# Patient Record
Sex: Female | Born: 1955 | Race: White | Hispanic: No | Marital: Married | Smoking: Former smoker
Health system: Southern US, Community
[De-identification: ages and names within clinical notes are randomized; demographics above are authoritative.]

## PROBLEM LIST (undated history)

## (undated) DIAGNOSIS — J449 Chronic obstructive pulmonary disease, unspecified: Secondary | ICD-10-CM

## (undated) DIAGNOSIS — M797 Fibromyalgia: Secondary | ICD-10-CM

## (undated) DIAGNOSIS — E119 Type 2 diabetes mellitus without complications: Secondary | ICD-10-CM

## (undated) DIAGNOSIS — G43909 Migraine, unspecified, not intractable, without status migrainosus: Secondary | ICD-10-CM

## (undated) DIAGNOSIS — H8109 Meniere's disease, unspecified ear: Secondary | ICD-10-CM

## (undated) DIAGNOSIS — M109 Gout, unspecified: Secondary | ICD-10-CM

## (undated) DIAGNOSIS — F419 Anxiety disorder, unspecified: Secondary | ICD-10-CM

## (undated) DIAGNOSIS — G5600 Carpal tunnel syndrome, unspecified upper limb: Secondary | ICD-10-CM

## (undated) HISTORY — DX: Migraine, unspecified, not intractable, without status migrainosus: G43.909

## (undated) HISTORY — DX: Carpal tunnel syndrome, unspecified upper limb: G56.00

## (undated) HISTORY — DX: Anxiety disorder, unspecified: F41.9

---

## 1997-10-01 ENCOUNTER — Ambulatory Visit (HOSPITAL_COMMUNITY): Admission: RE | Admit: 1997-10-01 | Discharge: 1997-10-01 | Payer: Self-pay | Admitting: Obstetrics and Gynecology

## 1997-10-29 ENCOUNTER — Ambulatory Visit (HOSPITAL_COMMUNITY): Admission: RE | Admit: 1997-10-29 | Discharge: 1997-10-29 | Payer: Self-pay | Admitting: Obstetrics and Gynecology

## 1997-12-12 ENCOUNTER — Ambulatory Visit (HOSPITAL_COMMUNITY): Admission: RE | Admit: 1997-12-12 | Discharge: 1997-12-12 | Payer: Self-pay | Admitting: Obstetrics and Gynecology

## 1997-12-19 ENCOUNTER — Ambulatory Visit (HOSPITAL_COMMUNITY): Admission: RE | Admit: 1997-12-19 | Discharge: 1997-12-19 | Payer: Self-pay | Admitting: Obstetrics and Gynecology

## 1998-02-19 ENCOUNTER — Inpatient Hospital Stay (HOSPITAL_COMMUNITY): Admission: AD | Admit: 1998-02-19 | Discharge: 1998-02-20 | Payer: Self-pay | Admitting: Obstetrics and Gynecology

## 2002-09-21 ENCOUNTER — Other Ambulatory Visit: Admission: RE | Admit: 2002-09-21 | Discharge: 2002-09-21 | Payer: Self-pay | Admitting: Obstetrics and Gynecology

## 2002-12-06 ENCOUNTER — Ambulatory Visit (HOSPITAL_COMMUNITY): Admission: RE | Admit: 2002-12-06 | Discharge: 2002-12-06 | Payer: Self-pay | Admitting: Family Medicine

## 2002-12-06 ENCOUNTER — Encounter: Payer: Self-pay | Admitting: Family Medicine

## 2009-12-02 ENCOUNTER — Other Ambulatory Visit: Admission: RE | Admit: 2009-12-02 | Discharge: 2009-12-02 | Payer: Self-pay | Admitting: Family Medicine

## 2014-10-04 ENCOUNTER — Ambulatory Visit
Admission: RE | Admit: 2014-10-04 | Discharge: 2014-10-04 | Disposition: A | Discharge status: Home / Self Care | Payer: BLUE CROSS/BLUE SHIELD | Source: Ambulatory Visit | Attending: Family Medicine | Admitting: Family Medicine

## 2014-10-04 ENCOUNTER — Other Ambulatory Visit: Payer: Self-pay | Admitting: Family Medicine

## 2014-10-04 DIAGNOSIS — M79672 Pain in left foot: Secondary | ICD-10-CM

## 2015-11-05 DIAGNOSIS — M545 Low back pain: Secondary | ICD-10-CM | POA: Diagnosis not present

## 2016-06-12 DIAGNOSIS — J0110 Acute frontal sinusitis, unspecified: Secondary | ICD-10-CM | POA: Diagnosis not present

## 2016-07-27 DIAGNOSIS — J301 Allergic rhinitis due to pollen: Secondary | ICD-10-CM | POA: Diagnosis not present

## 2016-07-27 DIAGNOSIS — R490 Dysphonia: Secondary | ICD-10-CM | POA: Diagnosis not present

## 2016-07-27 DIAGNOSIS — H8103 Meniere's disease, bilateral: Secondary | ICD-10-CM | POA: Diagnosis not present

## 2016-08-18 DIAGNOSIS — J410 Simple chronic bronchitis: Secondary | ICD-10-CM | POA: Diagnosis not present

## 2016-08-18 DIAGNOSIS — J370 Chronic laryngitis: Secondary | ICD-10-CM | POA: Diagnosis not present

## 2016-08-18 DIAGNOSIS — J320 Chronic maxillary sinusitis: Secondary | ICD-10-CM | POA: Diagnosis not present

## 2016-08-18 DIAGNOSIS — J322 Chronic ethmoidal sinusitis: Secondary | ICD-10-CM | POA: Diagnosis not present

## 2016-10-24 DIAGNOSIS — M109 Gout, unspecified: Secondary | ICD-10-CM | POA: Diagnosis not present

## 2016-11-20 DIAGNOSIS — J019 Acute sinusitis, unspecified: Secondary | ICD-10-CM | POA: Diagnosis not present

## 2016-12-04 DIAGNOSIS — J209 Acute bronchitis, unspecified: Secondary | ICD-10-CM | POA: Diagnosis not present

## 2016-12-04 DIAGNOSIS — M7061 Trochanteric bursitis, right hip: Secondary | ICD-10-CM | POA: Diagnosis not present

## 2016-12-04 DIAGNOSIS — M7062 Trochanteric bursitis, left hip: Secondary | ICD-10-CM | POA: Diagnosis not present

## 2017-01-19 ENCOUNTER — Ambulatory Visit
Admission: RE | Admit: 2017-01-19 | Discharge: 2017-01-19 | Disposition: A | Discharge status: Home / Self Care | Payer: BLUE CROSS/BLUE SHIELD | Source: Ambulatory Visit | Attending: Family Medicine | Admitting: Family Medicine

## 2017-01-19 ENCOUNTER — Other Ambulatory Visit: Payer: Self-pay | Admitting: Family Medicine

## 2017-01-19 DIAGNOSIS — J40 Bronchitis, not specified as acute or chronic: Secondary | ICD-10-CM | POA: Diagnosis not present

## 2017-01-19 DIAGNOSIS — R062 Wheezing: Secondary | ICD-10-CM | POA: Diagnosis not present

## 2017-02-08 DIAGNOSIS — R0602 Shortness of breath: Secondary | ICD-10-CM | POA: Diagnosis not present

## 2017-02-08 DIAGNOSIS — R062 Wheezing: Secondary | ICD-10-CM | POA: Diagnosis not present

## 2017-04-09 DIAGNOSIS — Z23 Encounter for immunization: Secondary | ICD-10-CM | POA: Diagnosis not present

## 2017-05-31 DIAGNOSIS — J410 Simple chronic bronchitis: Secondary | ICD-10-CM | POA: Diagnosis not present

## 2017-06-15 ENCOUNTER — Encounter: Payer: Self-pay | Admitting: Internal Medicine

## 2017-06-15 ENCOUNTER — Ambulatory Visit (INDEPENDENT_AMBULATORY_CARE_PROVIDER_SITE_OTHER): Payer: BLUE CROSS/BLUE SHIELD | Admitting: Internal Medicine

## 2017-06-15 ENCOUNTER — Other Ambulatory Visit (INDEPENDENT_AMBULATORY_CARE_PROVIDER_SITE_OTHER): Payer: BLUE CROSS/BLUE SHIELD

## 2017-06-15 ENCOUNTER — Ambulatory Visit (INDEPENDENT_AMBULATORY_CARE_PROVIDER_SITE_OTHER)
Admission: RE | Admit: 2017-06-15 | Discharge: 2017-06-15 | Disposition: A | Discharge status: Home / Self Care | Payer: BLUE CROSS/BLUE SHIELD | Source: Ambulatory Visit | Attending: Internal Medicine | Admitting: Internal Medicine

## 2017-06-15 VITALS — BP 120/76 | HR 83 | Ht 64.0 in | Wt 238.8 lb

## 2017-06-15 DIAGNOSIS — R0600 Dyspnea, unspecified: Secondary | ICD-10-CM

## 2017-06-15 DIAGNOSIS — R0609 Other forms of dyspnea: Secondary | ICD-10-CM

## 2017-06-15 DIAGNOSIS — T502X5A Adverse effect of carbonic-anhydrase inhibitors, benzothiadiazides and other diuretics, initial encounter: Secondary | ICD-10-CM | POA: Diagnosis not present

## 2017-06-15 DIAGNOSIS — E876 Hypokalemia: Secondary | ICD-10-CM

## 2017-06-15 DIAGNOSIS — J449 Chronic obstructive pulmonary disease, unspecified: Secondary | ICD-10-CM | POA: Diagnosis not present

## 2017-06-15 DIAGNOSIS — R0602 Shortness of breath: Secondary | ICD-10-CM | POA: Diagnosis not present

## 2017-06-15 LAB — CBC WITH DIFFERENTIAL/PLATELET
BASOS ABS: 0.1 10*3/uL (ref 0.0–0.1)
Basophils Relative: 1 % (ref 0.0–3.0)
EOS PCT: 3.2 % (ref 0.0–5.0)
Eosinophils Absolute: 0.3 10*3/uL (ref 0.0–0.7)
HCT: 42.5 % (ref 36.0–46.0)
HEMOGLOBIN: 14.3 g/dL (ref 12.0–15.0)
Lymphocytes Relative: 21.9 % (ref 12.0–46.0)
Lymphs Abs: 1.9 10*3/uL (ref 0.7–4.0)
MCHC: 33.7 g/dL (ref 30.0–36.0)
MCV: 94.2 fl (ref 78.0–100.0)
MONO ABS: 0.6 10*3/uL (ref 0.1–1.0)
MONOS PCT: 7.3 % (ref 3.0–12.0)
Neutro Abs: 5.6 10*3/uL (ref 1.4–7.7)
Neutrophils Relative %: 66.6 % (ref 43.0–77.0)
Platelets: 322 10*3/uL (ref 150.0–400.0)
RBC: 4.52 Mil/uL (ref 3.87–5.11)
RDW: 13.3 % (ref 11.5–15.5)
WBC: 8.5 10*3/uL (ref 4.0–10.5)

## 2017-06-15 LAB — BASIC METABOLIC PANEL
BUN: 10 mg/dL (ref 6–23)
CHLORIDE: 98 meq/L (ref 96–112)
CO2: 32 meq/L (ref 19–32)
Calcium: 9.8 mg/dL (ref 8.4–10.5)
Creatinine, Ser: 0.64 mg/dL (ref 0.40–1.20)
GFR: 100.91 mL/min (ref >60.00)
GLUCOSE: 170 mg/dL — AB (ref 70–99)
POTASSIUM: 3.1 meq/L — AB (ref 3.5–5.1)
Sodium: 138 mEq/L (ref 135–145)

## 2017-06-15 LAB — TSH: TSH: 2.17 u[IU]/mL (ref 0.35–4.50)

## 2017-06-15 LAB — BRAIN NATRIURETIC PEPTIDE: Pro B Natriuretic peptide (BNP): 19 pg/mL (ref 0.0–100.0)

## 2017-06-15 MED ORDER — GLYCOPYRROLATE-FORMOTEROL 9-4.8 MCG/ACT IN AERO: 2.0000 | INHALATION_SPRAY | Freq: Two times a day (BID) | RESPIRATORY_TRACT | 11 refills | Status: DC

## 2017-06-15 MED ORDER — GLYCOPYRROLATE-FORMOTEROL 9-4.8 MCG/ACT IN AERO: 2.0000 | INHALATION_SPRAY | Freq: Two times a day (BID) | RESPIRATORY_TRACT | 0 refills | Status: DC

## 2017-06-15 NOTE — Patient Instructions (Addendum)
Stop breo and try bevespi Take 2 puffs first thing in am and then another 2 puffs about 12 hours later.   GERD (REFLUX)  is an extremely common cause of respiratory symptoms just like yours , many times with no obvious heartburn at all.    It can be treated with medication, but also with lifestyle changes including elevation of the head of your bed (ideally with 6 inch  bed blocks),  Smoking cessation, avoidance of late meals, excessive alcohol, and avoid fatty foods, chocolate, peppermint, colas, red wine, and acidic juices such as orange juice.  NO MINT OR MENTHOL PRODUCTS SO NO COUGH DROPS  USE SUGARLESS CANDY INSTEAD (Jolley ranchers or Stover's or Life Savers) or even ice chips will also do - the key is to swallow to prevent all throat clearing. NO OIL BASED VITAMINS - use powdered substitutes.      Please remember to go to the lab and x-ray department downstairs in the basement  for your tests - we will call you with the results when they are available.   Please schedule a follow up office visit in 4 weeks, sooner if needed

## 2017-06-15 NOTE — Progress Notes (Signed)
Subjective:    Patient ID: Taylor Hanson, female    DOB: [birth year],    MRN: [id number]  HPI   [age] yowf  Quit smoking in 2015 no symptoms with wt around 230-240 with new onset cough May 2018  referred to pulmonary clinic 06/15/2017 by Dr  Tiburcio PeaHarris for ? Copd with ? GOLD II criteria on initial spirometry (sub optimal Texp)    06/15/2017 1st [facility] office visit/ Wert   Chief Complaint  Patient presents with  . pulm consult    Pt referred by Dr. Johny BlamerWilliam Harris for possible COPD. Pt has some chest tightness and pain on left side at times. Pt has SOB with exertion with some wheezing.  onset of cough in May 2018 rx augmentin seemed a lot better then sev weeks later noted sob in shower  And doe = MMRC3 = can't walk 100 yards even at a slow pace at a flat grade s stopping due to sob  Breo no better  Sleeps R side down on 2 pillows  Nasal congestion x years attributes to dust and smoke not typically seasonal  Eyes draining spring > fall  Reports variable HB also but afraid to take ppi's after what she's read about them    No obvious day to day or daytime variability or assoc excess/ purulent sputum or mucus plugs or hemoptysis or cp or chest tightness . No unusual exposure hx or h/o childhood pna/ asthma or knowledge of premature birth.  Sleeping ok flat on 2 pillows on side  without nocturnal  or early am exacerbation  of respiratory  c/o's or need for noct saba. Also denies any obvious fluctuation of symptoms with weather or environmental changes or other aggravating or alleviating factors except as outlined above   Current Allergies, Complete Past Medical History, Past Surgical History, Family History, and Social History were reviewed in [facility] electronic medical record.           Current Meds  Medication Sig  . chlorthalidone (HYGROTON) 25 MG tablet Take 25 mg by mouth daily.  . fluticasone furoate-vilanterol (BREO ELLIPTA) 100-25 MCG/INH AEPB Inhale 1 puff into  the lungs daily.  Marland Kitchen. loratadine (CLARITIN) 10 MG tablet Take 10 mg by mouth daily.  . potassium chloride (MICRO-K) 10 MEQ CR capsule Take 10 mEq by mouth 2 (two) times daily.         Review of Systems  Constitutional: Negative for fever and unexpected weight change.  HENT: Negative for congestion, dental problem, ear pain, nosebleeds, postnasal drip, rhinorrhea, sinus pressure, sneezing, sore throat and trouble swallowing.   Eyes: Negative for redness and itching.  Respiratory: Positive for cough, chest tightness, shortness of breath and wheezing.   Cardiovascular: Negative for palpitations and leg swelling.  Gastrointestinal: Negative for nausea and vomiting.  Genitourinary: Negative for dysuria.  Musculoskeletal: Negative for joint swelling.  Skin: Negative for rash.  Allergic/Immunologic: Positive for environmental allergies. Negative for food allergies and immunocompromised state.  Neurological: Positive for headaches.  Hematological: Does not bruise/bleed easily.  Psychiatric/Behavioral: Negative for dysphoric mood. The patient is not nervous/anxious.        Objective:   Physical Exam  Obese pleasant amb wf nad  Wt Readings from Last 3 Encounters:  06/15/17 238 lb 12.8 oz (108.3 kg)    Vital signs reviewed - Note on arrival 02 sats  98% on RA     HEENT: nl dentition, turbinates bilaterally, and oropharynx. Nl external ear canals without cough reflex  NECK :  without JVD/Nodes/TM/ nl carotid upstrokes bilaterally   LUNGS: no acc muscle use,  Nl contour chest which is clear to A and P bilaterally without cough on insp or exp maneuvers   CV:  RRR  no s3 or murmur or increase in P2, and no edema   ABD:  soft and nontender with nl inspiratory excursion in the supine position. No bruits or organomegaly appreciated, bowel sounds nl  MS:  Nl gait/ ext warm without deformities, calf tenderness, cyanosis or clubbing No obvious joint restrictions   SKIN: warm and dry  without lesions    NEURO:  alert, approp, nl sensorium with  no motor or cerebellar deficits apparent.     CXR PA and Lateral:   06/15/2017 :    I personally reviewed images and agree with radiology impression as follows:    Mild bilateral interstitial prominence noted   Labs ordered/ reviewed:      Chemistry      Component Value Date/Time   NA 138 06/15/2017 1118   K 3.1 (L) 06/15/2017 1118   CL 98 06/15/2017 1118   CO2 32 06/15/2017 1118   BUN 10 06/15/2017 1118   CREATININE 0.64 06/15/2017 1118      Component Value Date/Time   CALCIUM 9.8 06/15/2017 1118        Lab Results  Component Value Date   WBC 8.5 06/15/2017   HGB 14.3 06/15/2017   HCT 42.5 06/15/2017   MCV 94.2 06/15/2017   PLT 322.0 06/15/2017        EOS                      0.3                                                                    06/15/2017       Lab Results  Component Value Date   TSH 2.17 06/15/2017     Lab Results  Component Value Date   PROBNP 19.0 06/15/2017      Labs ordered 06/15/2017   Allergy profile         Assessment & Plan:

## 2017-06-16 ENCOUNTER — Encounter: Payer: Self-pay | Admitting: Internal Medicine

## 2017-06-16 DIAGNOSIS — T502X5A Adverse effect of carbonic-anhydrase inhibitors, benzothiadiazides and other diuretics, initial encounter: Secondary | ICD-10-CM

## 2017-06-16 DIAGNOSIS — J449 Chronic obstructive pulmonary disease, unspecified: Secondary | ICD-10-CM | POA: Insufficient documentation

## 2017-06-16 DIAGNOSIS — E876 Hypokalemia: Secondary | ICD-10-CM | POA: Insufficient documentation

## 2017-06-16 LAB — RESPIRATORY ALLERGY PROFILE REGION II ~~LOC~~
ALLERGEN, D PTERNOYSSINUS, D1: 0.72 kU/L — AB
ALLERGEN, MOUSE U PROTEIN, E72: 4.72 kU/L — AB
Allergen, Cedar tree, t12: <0.1 kU/L
Allergen, Comm Silver Birch, t9: <0.1 kU/L
Allergen, Mulberry, t76: <0.1 kU/L
Allergen, Oak,t7: <0.1 kU/L
Allergen, P. notatum, m1: <0.1 kU/L
BERMUDA GRASS: 0.34 kU/L — AB
CLADOSPORIUM HERBARUM (M2) IGE: <0.1 kU/L
CLASS: 0
CLASS: 0
CLASS: 0
CLASS: 0
CLASS: 0
CLASS: 1
CLASS: 2
CLASS: 3
CLASS: 3
COMMON RAGWEED (SHORT) (W1) IGE: 0.1 kU/L — ABNORMAL HIGH
Cat Dander: 4.2 kU/L — ABNORMAL HIGH
Class: 0
Class: 0
Class: 0
Class: 0
Class: 0
Class: 0
Class: 0
Class: 0
Class: 0
Class: 0
Class: 0
Class: 0
Class: 0
Class: 1
Class: 3
Cockroach: <0.1 kU/L
D. FARINAE: 0.45 kU/L — AB
DOG DANDER: 13.6 kU/L — AB
IGE (IMMUNOGLOBULIN E), SERUM: 271 kU/L — AB (ref <=114)
JOHNSON GRASS: 0.16 kU/L — AB
Pecan/Hickory Tree IgE: <0.1 kU/L
Rough Pigweed  IgE: <0.1 kU/L
TIMOTHY GRASS: 0.41 kU/L — AB

## 2017-06-16 LAB — INTERPRETATION:

## 2017-06-16 NOTE — Assessment & Plan Note (Addendum)
  When respiratory symptoms begin or become refractory well after a patient reports complete smoking cessation,  Especially when this wasn't the case while they were smoking, a red flag is raised based on the work of Dr Primitivo GauzeFletcher which states:  if you quit smoking when your best day FEV1 is still well preserved it is highly unlikely you will progress to severe disease.  That is to say, once the smoking stops,  the symptoms should not suddenly erupt or markedly worsen.  If so, the differential diagnosis should include  obesity/deconditioning,  LPR/Reflux/Aspiration syndromes,  occult CHF, or  especially side effect of medications commonly used in this population the last of which does not appear applicable here.    Her bnp is so low that it's very unlikely she has chf but she does have slightly prominent markings suggesting ILD but note sats great on fast walk so no immediate need for w/u >  Will do full pfts on return and in meantime strongly rec   Paced  (submaximal) ex x up to 30 min daily  GERD diet  Low threshold to add ppi short term trial

## 2017-06-16 NOTE — Assessment & Plan Note (Signed)
Body mass index is 40.99 kg/m.  -  Lab Results  Component Value Date   TSH 2.17 06/15/2017     Contributing to gerd risk/ doe/reviewed the need and the process to achieve and maintain neg calorie balance > defer f/u primary care including intermittently monitoring thyroid status

## 2017-06-16 NOTE — Assessment & Plan Note (Signed)
rec she double whatever dose she's actually taking of her K supplement and Follow up per Primary Care planned

## 2017-06-16 NOTE — Progress Notes (Signed)
LMTCB

## 2017-06-16 NOTE — Assessment & Plan Note (Addendum)
Quit smoking 2015 - 06/15/2017   Walked RA x one lap @ 185 stopped due to  Sob at very fast pace, no desat - Spirometry 06/15/2017  FEV1 1.58 (61%)  Ratio 74 with mod curvature and Texp < 6 sec  - 06/15/2017  After extensive coaching HFA effectiveness =    75% from a baseline of 50% > bevespi trial     Not really sure copd is the limiting problem here but worth trying a full court press with bevespi trial and return in 6 weeks for PFTs   Total time devoted to counseling  > 50 % of initial 60 min office visit:  review case with pt/ discussion of options/alternatives/ personally creating written customized instructions  in presence of pt  then going over those specific  Instructions directly with the pt including how to use all of the meds but in particular covering each new medication in detail and the difference between the maintenance= "automatic" meds and the prns using an action plan format for the latter (If this problem/symptom => do that organization reading Left to right).  Please see AVS from this visit for a full list of these instructions which I personally wrote for this pt and  are unique to this visit.

## 2017-06-17 ENCOUNTER — Telehealth: Payer: Self-pay | Admitting: Internal Medicine

## 2017-06-17 NOTE — Telephone Encounter (Signed)
Notes recorded by Nyoka CowdenWert, Michael B, MD on 06/15/2017 at 5:13 PM EST Call pt: Reviewed cxr and no acute change so no change in recommendations made at    Notes recorded by Nyoka CowdenWert, Michael B, MD on 06/16/2017 at 5:19 PM EST Call patient : Studies are uc/w mod allergies to dog > cat and grass/ ragweed  Also K too low so whatever dose of K she takes should be doubled until next ov with PCP   Advised pt of results. Pt understood and nothing further is needed.   [facility], [facility]

## 2017-06-17 NOTE — Telephone Encounter (Signed)
Patient returning call from Verlon AuLeslie - states call was to her home and she asks that we call her cell [phone].

## 2017-06-17 NOTE — Progress Notes (Signed)
LMTCB

## 2017-06-21 ENCOUNTER — Other Ambulatory Visit: Payer: Self-pay | Admitting: Internal Medicine

## 2017-06-21 ENCOUNTER — Encounter: Payer: Self-pay | Admitting: Internal Medicine

## 2017-06-22 ENCOUNTER — Other Ambulatory Visit: Payer: Self-pay | Admitting: Family Medicine

## 2017-06-22 ENCOUNTER — Other Ambulatory Visit (HOSPITAL_COMMUNITY)
Admission: RE | Admit: 2017-06-22 | Discharge: 2017-06-22 | Disposition: A | Discharge status: Home / Self Care | Payer: BLUE CROSS/BLUE SHIELD | Source: Ambulatory Visit | Attending: Family Medicine | Admitting: Family Medicine

## 2017-06-22 DIAGNOSIS — Z1211 Encounter for screening for malignant neoplasm of colon: Secondary | ICD-10-CM | POA: Diagnosis not present

## 2017-06-22 DIAGNOSIS — R739 Hyperglycemia, unspecified: Secondary | ICD-10-CM | POA: Diagnosis not present

## 2017-06-22 DIAGNOSIS — Z0000 Encounter for general adult medical examination without abnormal findings: Secondary | ICD-10-CM | POA: Diagnosis not present

## 2017-06-22 DIAGNOSIS — E7800 Pure hypercholesterolemia, unspecified: Secondary | ICD-10-CM | POA: Diagnosis not present

## 2017-06-22 DIAGNOSIS — M1A0790 Idiopathic chronic gout, unspecified ankle and foot, without tophus (tophi): Secondary | ICD-10-CM | POA: Diagnosis not present

## 2017-06-23 MED ORDER — GLYCOPYRROLATE-FORMOTEROL 9-4.8 MCG/ACT IN AERO: 2.0000 | INHALATION_SPRAY | Freq: Two times a day (BID) | RESPIRATORY_TRACT | 5 refills | Status: DC

## 2017-06-23 NOTE — Telephone Encounter (Signed)
Key is #[id number] on [email or web address]

## 2017-06-23 NOTE — Telephone Encounter (Signed)
Checked the both fax machines and MW's folder up front. Did not see any PA requests with patient's name. Will await her response to see which medication it is.

## 2017-06-24 ENCOUNTER — Telehealth: Payer: Self-pay | Admitting: Internal Medicine

## 2017-06-24 LAB — CYTOLOGY - PAP
ADEQUACY: ABSENT
DIAGNOSIS: NEGATIVE
HPV (WINDOPATH): NOT DETECTED

## 2017-06-24 NOTE — Telephone Encounter (Signed)
  Taylor Hanson Key: [id number] Need help? Call us at [phone]  Status  Additional Information Required  DrugBevespi Aerosphere 9-4.8MCG/ACT IN AERO  Form[employer] Cross [place] Commercial Electronic Request Form (CB)   Called pt and advised her PA initiated on CMM. We will let her know once we receive a determination.

## 2017-06-24 NOTE — Telephone Encounter (Signed)
I received PA denial on her Bevespi today Covered alternatives are Stiolto and Anoro  LMTCB for the pt and will forward to MW for recs on alternative  Here is the last AVS:  Stop breo and try bevespi Take 2 puffs first thing in am and then another 2 puffs about 12 hours later.   GERD (REFLUX)  is an extremely common cause of respiratory symptoms just like yours , many times with no obvious heartburn at all.    It can be treated with medication, but also with lifestyle changes including elevation of the head of your bed (ideally with 6 inch  bed blocks),  Smoking cessation, avoidance of late meals, excessive alcohol, and avoid fatty foods, chocolate, peppermint, colas, red wine, and acidic juices such as orange juice.  NO MINT OR MENTHOL PRODUCTS SO NO COUGH DROPS  USE SUGARLESS CANDY INSTEAD (Jolley ranchers or Stover's or Life Savers) or even ice chips will also do - the key is to swallow to prevent all throat clearing. NO OIL BASED VITAMINS - use powdered substitutes.      Please remember to go to the lab and x-ray department downstairs in the basement  for your tests - we will call you with the results when they are available.   Please schedule a follow up office visit in 4 weeks, sooner if needed

## 2017-06-25 ENCOUNTER — Telehealth: Payer: Self-pay | Admitting: Internal Medicine

## 2017-06-25 NOTE — Telephone Encounter (Signed)
[facility]  Key: [id number]  [facility]

## 2017-06-25 NOTE — Telephone Encounter (Signed)
Would not waste time on PA - give enough bevespi samples to get her thru the next 3 weeks then I'll see her back and train her on the stiolto at her appt in Jan

## 2017-06-25 NOTE — Telephone Encounter (Signed)
Ok 2 samples left up front for pt. Pt notified and nothing further is needed.

## 2017-06-28 NOTE — Telephone Encounter (Signed)
She's due back 07/15/17 so just give her enough samples of bevespi and we'll work it out then

## 2017-06-28 NOTE — Telephone Encounter (Signed)
2 samples already left 06/25/17 so will close encounter

## 2017-06-28 NOTE — Telephone Encounter (Signed)
[facility] the Eyvonne LeftBevespi was denied  She needs to try and fail Anoro and Stiolto first  Please advise thanks Here is the last AVS:  Stop breo and try bevespi Take 2 puffs first thing in am and then another 2 puffs about 12 hours later.    GERD (REFLUX)  is an extremely common cause of respiratory symptoms just like yours , many times with no obvious heartburn at all.     It can be treated with medication, but also with lifestyle changes including elevation of the head of your bed (ideally with 6 inch  bed blocks),  Smoking cessation, avoidance of late meals, excessive alcohol, and avoid fatty foods, chocolate, peppermint, colas, red wine, and acidic juices such as orange juice.  NO MINT OR MENTHOL PRODUCTS SO NO COUGH DROPS  USE SUGARLESS CANDY INSTEAD (Jolley ranchers or Stover's or Life Savers) or even ice chips will also do - the key is to swallow to prevent all throat clearing. NO OIL BASED VITAMINS - use powdered substitutes.       Please remember to go to the lab and x-ray department downstairs in the basement  for your tests - we will call you with the results when they are available.     Please schedule a follow up office visit in 4 weeks, sooner if needed

## 2017-07-02 ENCOUNTER — Telehealth: Payer: Self-pay | Admitting: Emergency Medicine

## 2017-07-02 NOTE — Telephone Encounter (Signed)
Forms have been located and are in RB's look-at cubby.    Will route to LL to follow up on.

## 2017-07-08 NOTE — Telephone Encounter (Signed)
Forms will be addressed by RB once he returns to the office.

## 2017-07-12 DIAGNOSIS — E876 Hypokalemia: Secondary | ICD-10-CM | POA: Diagnosis not present

## 2017-07-12 DIAGNOSIS — M1A0790 Idiopathic chronic gout, unspecified ankle and foot, without tophus (tophi): Secondary | ICD-10-CM | POA: Diagnosis not present

## 2017-07-12 DIAGNOSIS — E119 Type 2 diabetes mellitus without complications: Secondary | ICD-10-CM | POA: Diagnosis not present

## 2017-07-15 ENCOUNTER — Ambulatory Visit (INDEPENDENT_AMBULATORY_CARE_PROVIDER_SITE_OTHER): Payer: BLUE CROSS/BLUE SHIELD | Admitting: Internal Medicine

## 2017-07-15 ENCOUNTER — Encounter: Payer: Self-pay | Admitting: Internal Medicine

## 2017-07-15 VITALS — BP 136/70 | Ht 64.0 in | Wt 238.6 lb

## 2017-07-15 DIAGNOSIS — J449 Chronic obstructive pulmonary disease, unspecified: Secondary | ICD-10-CM

## 2017-07-15 MED ORDER — ALBUTEROL SULFATE HFA 108 (90 BASE) MCG/ACT IN AERS: 1.0000 | INHALATION_SPRAY | Freq: Four times a day (QID) | RESPIRATORY_TRACT | 1 refills | Status: AC | PRN

## 2017-07-15 MED ORDER — FLUTICASONE-UMECLIDIN-VILANT 100-62.5-25 MCG/INH IN AEPB: 1.0000 | INHALATION_SPRAY | Freq: Every day | RESPIRATORY_TRACT | 0 refills | Status: DC

## 2017-07-15 NOTE — Progress Notes (Signed)
Subjective:    Patient ID: Taylor BrinkMarsha K Hanson, female    DOB: 1955/12/19,    MRN: [id number]  HPI   [age] yowf  Quit smoking in 2015 no symptoms with wt around 230-240 with new onset cough May 2018  referred to pulmonary clinic 06/15/2017 by Dr  Tiburcio PeaHarris for ? Copd with ? GOLD II criteria on initial spirometry (sub optimal Texp)    06/15/2017 1st [facility] office visit/ [name]   Chief Complaint  Patient presents with  . pulm consult    Pt referred by Dr. Johny BlamerWilliam Harris for possible COPD. Pt has some chest tightness and pain on left side at times. Pt has SOB with exertion with some wheezing.  onset of cough in May 2018 rx augmentin seemed a lot better then sev weeks later noted sob in shower  And doe = MMRC3 = can't walk 100 yards even at a slow pace at a flat grade s stopping due to sob  Breo no better  Sleeps R side down on 2 pillows  Nasal congestion x years attributes to dust and smoke not typically seasonal  Eyes draining spring > fall  Reports variable HB also but afraid to take ppi's after what she's read about them  rec Stop breo and try bevespi Take 2 puffs first thing in am and then another 2 puffs about 12 hours later.  GERD  Please remember to go to the lab and x-ray department downstairs in the basement  for your tests - we will call you with the results when they are available.     07/15/2017  f/u ov/Taylor Hanson re:  GOLD II  Chief Complaint  Patient presents with  . Follow-up    She stopped bevespi due tro insurance. She is back on Breo and is coughing less. She states her breathing has been okay, but she has not been exerting herself much. She is asking for refill on her rescue inhaler- current one is expired. She rarely uses it.   quit smoking Feb 2015 and breathing not nl ever since a bad "bronchitis" in May 2018 at about the same wt / cough better  Prior to May 2018 could walk to mb and back with difficulty slt incline to mb and now doesn't even try   No obvious day to  day or daytime variability or assoc excess/ purulent sputum or mucus plugs or hemoptysis or cp or chest tightness, subjective wheeze or overt sinus or hb symptoms. No unusual exposure hx or h/o childhood pna/ asthma or knowledge of premature birth.  Sleeping ok flat without nocturnal  or early am exacerbation  of respiratory  c/o's or need for noct saba. Also denies any obvious fluctuation of symptoms with weather or environmental changes or other aggravating or alleviating factors except as outlined above   Current Allergies, Complete Past Medical History, Past Surgical History, Family History, and Social History were reviewed in [facility] electronic medical record.  ROS  The following are not active complaints unless bolded Hoarseness, sore throat, dysphagia, dental problems, itching, sneezing,  nasal congestion or discharge of excess mucus or purulent secretions, ear ache,   fever, chills, sweats, unintended wt loss or wt gain, classically pleuritic or exertional cp,  orthopnea pnd or leg swelling, presyncope, palpitations, abdominal pain, anorexia, nausea, vomiting, diarrhea  or change in bowel habits or change in bladder habits, change in stools or change in urine, dysuria, hematuria,  rash, arthralgias, visual complaints, headache, numbness, weakness or ataxia or problems with  walking or coordination,  change in mood/affect or memory.        Current Meds  Medication Sig  . albuterol (PROAIR HFA) 108 (90 Base) MCG/ACT inhaler Inhale 1 puff into the lungs every 6 (six) hours as needed for wheezing or shortness of breath.  . chlorthalidone (HYGROTON) 25 MG tablet Take 25 mg by mouth every other day.   . loratadine (CLARITIN) 10 MG tablet Take 10 mg by mouth daily.  . potassium chloride (MICRO-K) 10 MEQ CR capsule Take 10 mEq by mouth daily.   . [     .   BREO ELLIPTA 200-25 MCG/INH AEPB Inhale 1 puff into the lungs daily.                       Objective:   Physical  Exam  Obese pleasant wf nad   07/15/2017         239   06/15/17 238 lb 12.8 oz (108.3 kg)    Vital signs reviewed - Note on arrival 02 sats  98% on RA     HEENT: nl dentition, turbinates bilaterally, and oropharynx. Nl external ear canals without cough reflex   NECK :  without JVD/Nodes/TM/ nl carotid upstrokes bilaterally   LUNGS: no acc muscle use,  Nl contour chest with distant bs bilaterally but no audible wheeze    CV:  RRR  no s3 or murmur or increase in P2, and no edema   ABD:  Quite obese but soft and nontender with limitd  inspiratory excursion in the supine position. No bruits or organomegaly appreciated, bowel sounds nl  MS:  Nl gait/ ext warm without deformities, calf tenderness, cyanosis or clubbing No obvious joint restrictions   SKIN: warm and dry without lesions    NEURO:  alert, approp, nl sensorium with  no motor or cerebellar deficits apparent.                    Assessment & Plan:

## 2017-07-15 NOTE — Patient Instructions (Signed)
Stop BREO and start Trelegy one click each am   Please schedule a follow up office visit in 4 weeks, sooner if needed for PFT's without  Trelegy that am only

## 2017-07-18 ENCOUNTER — Encounter: Payer: Self-pay | Admitting: Internal Medicine

## 2017-07-18 NOTE — Assessment & Plan Note (Addendum)
Quit smoking 2015 - 06/15/2017   Walked RA x one lap @ 185 stopped due to  Sob at very fast pace, no desat - Spirometry 06/15/2017  FEV1 1.58 (61%)  Ratio 74 with mod curvature and Texp < 6 sec  - 06/15/2017   > bevespi trial  > preferred breo  - Allergy profile 06/15/17  >  Eos 0.3 /  IgE    271  RAST pos for dog> cat, grass and ragweed  - 07/15/2017  After extensive coaching inhaler device  effectiveness =   90% with DPI > try Trelegy for doe to MB  I strongly  Suspect obesity / deconditioning are more impt factors than Ve limitations from copd or asthmatic component  but will try trelegy to see if helps and bring back for f/u p trial  I had an extended discussion with the patient reviewing all relevant studies completed to date and  lasting 15 to 20 minutes of a 25 minute visit on the following ongoing concerns:  Formulary restrictions will be an ongoing challenge for the forseable future and I would be happy to pick an alternative if the pt will first  provide me a list of them but pt  will need to return here for training for any new device that is required eg dpi vs hfa vs respimat.    In meantime we can always provide samples so the patient never runs out of any needed respiratory medications.    Each maintenance medication was reviewed in detail including most importantly the difference between maintenance and as needed and under what circumstances the prns are to be used.  Please see AVS for specific  Instructions which are unique to this visit and I personally typed out  which were reviewed in detail in writing with the patient and a copy provided.

## 2017-07-18 NOTE — Assessment & Plan Note (Signed)
Body mass index is 40.96 kg/m.  -  trending up slightly  Lab Results  Component Value Date   TSH 2.17 06/15/2017     Contributing to gerd risk/ doe/reviewed the need and the process to achieve and maintain neg calorie balance > defer f/u primary care including intermittently monitoring thyroid status

## 2017-07-19 DIAGNOSIS — E876 Hypokalemia: Secondary | ICD-10-CM | POA: Diagnosis not present

## 2017-07-21 DIAGNOSIS — Z713 Dietary counseling and surveillance: Secondary | ICD-10-CM | POA: Diagnosis not present

## 2017-07-27 ENCOUNTER — Telehealth: Payer: Self-pay | Admitting: *Deleted

## 2017-07-27 NOTE — Telephone Encounter (Addendum)
Called [employer] [place] for the bevespi rx that was sent over for the PA to be done.  [employer] stated that this medication is not covered by the pts insurance, so no PA is required, but we will need to change the medication.  RB please advise what med you would like to change the pt to.  Thanks

## 2017-07-29 ENCOUNTER — Encounter: Payer: Self-pay | Admitting: Internal Medicine

## 2017-07-29 MED ORDER — FLUTICASONE-UMECLIDIN-VILANT 100-62.5-25 MCG/INH IN AEPB: 1.0000 | INHALATION_SPRAY | Freq: Every day | RESPIRATORY_TRACT | 5 refills | Status: AC

## 2017-08-04 DIAGNOSIS — Z713 Dietary counseling and surveillance: Secondary | ICD-10-CM | POA: Diagnosis not present

## 2017-08-05 NOTE — Telephone Encounter (Signed)
This pt was seen by Dr Sherene SiresWert. Based on notes, it looks like he tried to start her on trelegy on 07/15/17?? I'll defer to dr Sherene SiresWert

## 2017-08-12 ENCOUNTER — Other Ambulatory Visit: Payer: Self-pay | Admitting: Internal Medicine

## 2017-08-12 DIAGNOSIS — J449 Chronic obstructive pulmonary disease, unspecified: Secondary | ICD-10-CM

## 2017-08-13 ENCOUNTER — Ambulatory Visit (INDEPENDENT_AMBULATORY_CARE_PROVIDER_SITE_OTHER): Payer: BLUE CROSS/BLUE SHIELD | Admitting: Internal Medicine

## 2017-08-13 ENCOUNTER — Encounter: Payer: Self-pay | Admitting: Internal Medicine

## 2017-08-13 VITALS — BP 156/84 | HR 76 | Ht 64.0 in | Wt 240.0 lb

## 2017-08-13 DIAGNOSIS — J449 Chronic obstructive pulmonary disease, unspecified: Secondary | ICD-10-CM

## 2017-08-13 LAB — PULMONARY FUNCTION TEST
DL/VA % pred: 91 %
DL/VA: 4.39 ml/min/mmHg/L
DLCO COR % PRED: 107 %
DLCO COR: 26.13 ml/min/mmHg
DLCO UNC: 26.82 ml/min/mmHg
DLCO unc % pred: 110 %
FEF 25-75 POST: 1.73 L/s
FEF 25-75 PRE: 1.2 L/s
FEF2575-%Change-Post: 44 %
FEF2575-%PRED-PRE: 50 %
FEF2575-%Pred-Post: 72 %
FEV1-%Change-Post: 8 %
FEV1-%PRED-POST: 71 %
FEV1-%PRED-PRE: 65 %
FEV1-POST: 1.84 L
FEV1-Pre: 1.69 L
FEV1FVC-%Change-Post: 2 %
FEV1FVC-%PRED-PRE: 94 %
FEV6-%CHANGE-POST: 5 %
FEV6-%PRED-POST: 75 %
FEV6-%PRED-PRE: 71 %
FEV6-POST: 2.43 L
FEV6-Pre: 2.3 L
FEV6FVC-%CHANGE-POST: 0 %
FEV6FVC-%Pred-Post: 103 %
FEV6FVC-%Pred-Pre: 104 %
FVC-%Change-Post: 6 %
FVC-%PRED-PRE: 69 %
FVC-%Pred-Post: 73 %
FVC-POST: 2.45 L
FVC-PRE: 2.3 L
POST FEV6/FVC RATIO: 99 %
Post FEV1/FVC ratio: 75 %
Pre FEV1/FVC ratio: 74 %
Pre FEV6/FVC Ratio: 100 %
RV % pred: 129 %
RV: 2.55 L
TLC % PRED: 96 %
TLC: 4.88 L

## 2017-08-13 NOTE — Progress Notes (Signed)
PFT completed today 08/13/17  

## 2017-08-13 NOTE — Assessment & Plan Note (Addendum)
Quit smoking 2015 - 06/15/2017   Walked RA x one lap @ 185 stopped due to  Sob at very fast pace, no desat - Spirometry 06/15/2017  FEV1 1.58 (61%)  Ratio 74 with mod curvature and Texp < 6 sec  - 06/15/2017   > bevespi trial  > preferred breo  - Allergy profile 06/15/17  >  Eos 0.3 /  IgE    271  RAST pos for dog> cat, grass and ragweed - 07/15/2017  After extensive coaching inhaler device  effectiveness =   90% with DPI > try Trelegy for doe to MB   - PFT's  08/13/2017  FEV1 1.84 (71 % ) ratio 75  p 8 % improvement from saba p nothing prior to study with DLCO  110/107 % corrects to 91 % for alv volume  With mild curvature to f/v loop c/w small airways dz   I had an extended final summary discussion with the patient reviewing all relevant studies completed to date and  lasting 15 to 20 minutes of a 25 minute visit on the following issues:   1) she really does not meet the criteria for copd in the classical sense but has improved with trelegy > breo so reasonable to continue for now with low threshold to change to Incruse or Anoro thru the same device (elipta) with the fewer meds, esp ICS, the less the irritation to the her throat.  2)  She needs to remember to rinse/ gargle p use and reviewed optimal Ti as well with her today   3)   I  reviewed the Fletcher curve with the patient that basically indicates  if you quit smoking when your best day FEV1 is still well preserved (as is clearly  the case here)  it is highly unlikely you will progress to severe disease and informed the patient there was  no medication on the market that has proven to alter the curve/ its downward trajectory  or the likelihood of progression of their disease(unlike other chronic medical conditions such as atheroclerosis where we do think we can change the natural hx with risk reducing meds)    Therefore stopping smoking and maintaining abstinence are  the most important aspects of care, not choice of inhalers or for that matter,  doctors.   Treatment other than smoking cessation  is entirely directed by severity of symptoms and focused also on reducing exacerbations, not attempting to change the natural history of the disease.   Pulmonary f/u is therefore prn    Each maintenance medication was reviewed in detail including most importantly the difference between maintenance and as needed and under what circumstances the prns are to be used.  Please see AVS for specific  Instructions which are unique to this visit and I personally typed out  which were reviewed in detail in writing with the patient and a copy provided.

## 2017-08-13 NOTE — Patient Instructions (Addendum)
Plan A = Automatic = Trelegy is fine for now but if hoarseness gets worse have Dr Tiburcio PeaHarris change you to [facility] Novamednoro   Plan B = Backup Only use your albuterol as a rescue medication to be used if you can't catch your breath by resting or doing a relaxed purse lip breathing pattern.  - The less you use it, the better it will work when you need it. - Ok to use the inhaler up to 2 puffs  every 4 hours if you must but call for appointment if use goes up over your usual need - Don't leave home without it !!  (think of it like the spare tire for your car)     To get the most out of exercise, you need to be continuously aware that you are short of breath, but never out of breath, for 30 minutes daily. As you improve, it will actually be easier for you to do the same amount of exercise  in  30 minutes so always push to the level where you are short of breath.        If you are satisfied with your treatment plan,  let your doctor know and he/she can either refill your medications or you can return here when your prescription runs out.     If in any way you are not 100% satisfied,  please tell us.  If 100% better, tell your friends!  Pulmonary follow up is as needed

## 2017-08-13 NOTE — Progress Notes (Signed)
Subjective:    Patient ID: Taylor BrinkMarsha K Nitsch, female    DOB: 1956/03/16,    MRN: [id number]     Brief patient profile:  [id number] [id number]  Quit smoking in 2015 no symptoms with wt around 230-240 with new onset cough May 2018  referred to pulmonary clinic 06/15/2017 by Dr  Tiburcio PeaHarris for ? Copd with ? GOLD II criteria on initial spirometry (sub optimal Texp)   History of Present Illness  06/15/2017 1st [facility] office visit/ Wert   Chief Complaint  Patient presents with  . pulm consult    Pt referred by Dr. Johny BlamerWilliam Harris for possible COPD. Pt has some chest tightness and pain on left side at times. Pt has SOB with exertion with some wheezing.  onset of cough in May 2018 rx augmentin seemed a lot better then sev weeks later noted sob in shower  And doe = MMRC3 = can't walk 100 yards even at a slow pace at a flat grade s stopping due to sob  Breo no better  Sleeps R side down on 2 pillows  Nasal congestion x years attributes to dust and smoke not typically seasonal  Eyes draining spring > fall  Reports variable HB also but afraid to take ppi's after what she's read about them  rec Stop breo and try bevespi Take 2 puffs first thing in am and then another 2 puffs about 12 hours later.  GERD     07/15/2017  f/u ov/Wert re:  GOLD II / trelegy  Chief Complaint  Patient presents with  . Follow-up    She stopped bevespi due tro insurance. She is back on Breo and is coughing less. She states her breathing has been okay, but she has not been exerting herself much. She is asking for refill on her rescue inhaler- current one is expired. She rarely uses it.   quit smoking Feb 2015 and breathing not nl ever since a bad "bronchitis" in May 2018 at about the same wt / cough better  Prior to May 2018 could walk to mb and back with difficulty slt incline to mb and now doesn't even try  rec Stop breo and try bevespi Take 2 puffs first thing in am and then another 2 puffs about 12 hours later.  GERD diet        08/13/2017  f/u ov/Wert re: copd gold 0 / trelegy one each am  Chief Complaint  Patient presents with  . Follow-up    PFT's done today.  Breathing has improved some. She is not coughing much at all. She has not had to use her albuterol inhaler.    Dyspnea:  mb and back better and walking down street some / making slow progress with ex tol  Cough: none Sleep: ok No need for saba    No obvious day to day or daytime variability or assoc excess/ purulent sputum or mucus plugs or hemoptysis or cp or chest tightness, subjective wheeze or overt sinus or hb symptoms. No unusual exposure hx or h/o childhood pna/ asthma or knowledge of premature birth.  Sleeping ok flat without nocturnal  or early am exacerbation  of respiratory  c/o's or need for noct saba. Also denies any obvious fluctuation of symptoms with weather or environmental changes or other aggravating or alleviating factors except as outlined above   Current Allergies, Complete Past Medical History, Past Surgical History, Family History, and Social History were reviewed in [facility] electronic medical record.  ROS  The following are not active complaints unless bolded Hoarseness, sore throat, dysphagia, dental problems, itching, sneezing,  nasal congestion or discharge of excess mucus or purulent secretions, ear ache,   fever, chills, sweats, unintended wt loss or wt gain, classically pleuritic or exertional cp,  orthopnea pnd or leg swelling, presyncope, palpitations, abdominal pain, anorexia, nausea, vomiting, diarrhea  or change in bowel habits or change in bladder habits, change in stools or change in urine, dysuria, hematuria,  rash, arthralgias, visual complaints, headache, numbness, weakness or ataxia or problems with walking or coordination,  change in mood/affect or memory.        Current Meds  Medication Sig  . albuterol (PROAIR HFA) 108 (90 Base) MCG/ACT inhaler Inhale 1 puff into the lungs every 6 (six) hours as  needed for wheezing or shortness of breath.  . chlorthalidone (HYGROTON) 25 MG tablet Take 25 mg by mouth every other day.   . Fluticasone-Umeclidin-Vilant (TRELEGY ELLIPTA) 100-62.5-25 MCG/INH AEPB Inhale 1 puff into the lungs daily.  Marland Kitchen loratadine (CLARITIN) 10 MG tablet Take 10 mg by mouth daily.  . potassium chloride (MICRO-K) 10 MEQ CR capsule Take 10 mEq by mouth daily.                Objective:   Physical Exam  Obese pleasant wf nad very mild hoarseness    08/13/2017         241  07/15/2017         239   06/15/17 238 lb 12.8 oz (108.3 kg)    Vital signs reviewed - Note on arrival 02 sats  98% on RA      HEENT: nl dentition, turbinates bilaterally, and oropharynx. Nl external ear canals without cough reflex   NECK :  without JVD/Nodes/TM/ nl carotid upstrokes bilaterally   LUNGS: no acc muscle use,  Nl contour chest which is clear to A and P bilaterally without cough on insp or exp maneuvers   CV:  RRR  no s3 or murmur or increase in P2, and no edema   ABD:  Obese/ soft and nontender with nl inspiratory excursion in the supine position. No bruits or organomegaly appreciated, bowel sounds nl  MS:  Nl gait/ ext warm without deformities, calf tenderness, cyanosis or clubbing No obvious joint restrictions   SKIN: warm and dry without lesions    NEURO:  alert, approp, nl sensorium with  no motor or cerebellar deficits apparent.              Assessment & Plan:

## 2017-08-13 NOTE — Assessment & Plan Note (Addendum)
ERV 27% at PFT's 08/13/2017 c/w obesity effects on lung vols  Body mass index is 41.2 kg/m.  -  trending up slightly  Lab Results  Component Value Date   TSH 2.17 06/15/2017     Contributing to gerd risk/ doe/reviewed the need and the process to achieve and maintain neg calorie balance > defer f/u primary care including intermittently monitoring thyroid status    see avs for instructions unique to this ov

## 2017-08-18 DIAGNOSIS — Z713 Dietary counseling and surveillance: Secondary | ICD-10-CM | POA: Diagnosis not present

## 2017-08-31 DIAGNOSIS — J0100 Acute maxillary sinusitis, unspecified: Secondary | ICD-10-CM | POA: Diagnosis not present

## 2017-08-31 DIAGNOSIS — H8103 Meniere's disease, bilateral: Secondary | ICD-10-CM | POA: Diagnosis not present

## 2017-09-01 DIAGNOSIS — Z713 Dietary counseling and surveillance: Secondary | ICD-10-CM | POA: Diagnosis not present

## 2017-09-15 DIAGNOSIS — Z713 Dietary counseling and surveillance: Secondary | ICD-10-CM | POA: Diagnosis not present

## 2017-09-24 DIAGNOSIS — E119 Type 2 diabetes mellitus without complications: Secondary | ICD-10-CM | POA: Diagnosis not present

## 2017-09-24 DIAGNOSIS — F419 Anxiety disorder, unspecified: Secondary | ICD-10-CM | POA: Diagnosis not present

## 2017-09-24 DIAGNOSIS — R42 Dizziness and giddiness: Secondary | ICD-10-CM | POA: Diagnosis not present

## 2017-09-26 ENCOUNTER — Other Ambulatory Visit: Payer: Self-pay

## 2017-09-26 ENCOUNTER — Encounter (HOSPITAL_COMMUNITY): Payer: Self-pay

## 2017-09-26 ENCOUNTER — Emergency Department (HOSPITAL_COMMUNITY)
Admission: EM | Admit: 2017-09-26 | Discharge: 2017-09-26 | Disposition: A | Discharge status: Home / Self Care | Payer: BLUE CROSS/BLUE SHIELD | Attending: Emergency Medicine | Admitting: Emergency Medicine

## 2017-09-26 DIAGNOSIS — J449 Chronic obstructive pulmonary disease, unspecified: Secondary | ICD-10-CM | POA: Insufficient documentation

## 2017-09-26 DIAGNOSIS — E119 Type 2 diabetes mellitus without complications: Secondary | ICD-10-CM | POA: Insufficient documentation

## 2017-09-26 DIAGNOSIS — E876 Hypokalemia: Secondary | ICD-10-CM | POA: Diagnosis not present

## 2017-09-26 DIAGNOSIS — H8103 Meniere's disease, bilateral: Secondary | ICD-10-CM | POA: Diagnosis not present

## 2017-09-26 DIAGNOSIS — Z79899 Other long term (current) drug therapy: Secondary | ICD-10-CM | POA: Diagnosis not present

## 2017-09-26 DIAGNOSIS — R42 Dizziness and giddiness: Secondary | ICD-10-CM

## 2017-09-26 DIAGNOSIS — H8109 Meniere's disease, unspecified ear: Secondary | ICD-10-CM

## 2017-09-26 DIAGNOSIS — H8101 Meniere's disease, right ear: Secondary | ICD-10-CM | POA: Diagnosis not present

## 2017-09-26 DIAGNOSIS — Z87891 Personal history of nicotine dependence: Secondary | ICD-10-CM | POA: Insufficient documentation

## 2017-09-26 HISTORY — DX: Chronic obstructive pulmonary disease, unspecified: J44.9

## 2017-09-26 HISTORY — DX: Fibromyalgia: M79.7

## 2017-09-26 HISTORY — DX: Type 2 diabetes mellitus without complications: E11.9

## 2017-09-26 HISTORY — DX: Gout, unspecified: M10.9

## 2017-09-26 HISTORY — DX: Meniere's disease, unspecified ear: H81.09

## 2017-09-26 LAB — CBC WITH DIFFERENTIAL/PLATELET
Basophils Absolute: 0.1 10*3/uL (ref 0.0–0.1)
Basophils Relative: 1 %
Eosinophils Absolute: 0.1 10*3/uL (ref 0.0–0.7)
Eosinophils Relative: 1 %
HCT: 46 % (ref 36.0–46.0)
Hemoglobin: 15.8 g/dL — ABNORMAL HIGH (ref 12.0–15.0)
Lymphocytes Relative: 20 %
Lymphs Abs: 1.7 10*3/uL (ref 0.7–4.0)
MCH: 31.5 pg (ref 26.0–34.0)
MCHC: 34.3 g/dL (ref 30.0–36.0)
MCV: 91.8 fL (ref 78.0–100.0)
Monocytes Absolute: 0.4 10*3/uL (ref 0.1–1.0)
Monocytes Relative: 5 %
Neutro Abs: 6.6 10*3/uL (ref 1.7–7.7)
Neutrophils Relative %: 73 %
Platelets: 317 10*3/uL (ref 150–400)
RBC: 5.01 MIL/uL (ref 3.87–5.11)
RDW: 13.3 % (ref 11.5–15.5)
WBC: 8.9 10*3/uL (ref 4.0–10.5)

## 2017-09-26 LAB — BASIC METABOLIC PANEL
Anion gap: 14 (ref 5–15)
BUN: 10 mg/dL (ref 6–20)
CO2: 25 mmol/L (ref 22–32)
Calcium: 9.8 mg/dL (ref 8.9–10.3)
Chloride: 96 mmol/L — ABNORMAL LOW (ref 101–111)
Creatinine, Ser: 0.72 mg/dL (ref 0.44–1.00)
GFR calc Af Amer: >60 mL/min (ref >60)
GFR calc non Af Amer: >60 mL/min (ref >60)
Glucose, Bld: 147 mg/dL — ABNORMAL HIGH (ref 65–99)
Potassium: 2.6 mmol/L — CL (ref 3.5–5.1)
Sodium: 135 mmol/L (ref 135–145)

## 2017-09-26 MED ORDER — POTASSIUM CHLORIDE 10 MEQ/100ML IV SOLN
10.0000 meq | INTRAVENOUS | Status: AC
  Administered 2017-09-26 (×2): 10 meq via INTRAVENOUS
  Filled 2017-09-26 (×2): qty 100

## 2017-09-26 MED ORDER — PROMETHAZINE HCL 25 MG PO TABS: 12.5000 mg | ORAL_TABLET | Freq: Four times a day (QID) | ORAL | 0 refills | Status: AC | PRN

## 2017-09-26 MED ORDER — LORAZEPAM 2 MG/ML IJ SOLN
1.0000 mg | Freq: Once | INTRAMUSCULAR | Status: DC
  Filled 2017-09-26: qty 1

## 2017-09-26 MED ORDER — MECLIZINE HCL 25 MG PO TABS
25.0000 mg | ORAL_TABLET | Freq: Once | ORAL | Status: AC
  Administered 2017-09-26: 25 mg via ORAL
  Filled 2017-09-26: qty 1

## 2017-09-26 MED ORDER — LORAZEPAM 1 MG PO TABS
1.0000 mg | ORAL_TABLET | Freq: Once | ORAL | Status: AC
  Administered 2017-09-26: 1 mg via ORAL
  Filled 2017-09-26: qty 1

## 2017-09-26 MED ORDER — MAGNESIUM SULFATE 2 GM/50ML IV SOLN
2.0000 g | Freq: Once | INTRAVENOUS | Status: AC
  Administered 2017-09-26: 2 g via INTRAVENOUS
  Filled 2017-09-26: qty 50

## 2017-09-26 MED ORDER — PROMETHAZINE HCL 25 MG/ML IJ SOLN
12.5000 mg | Freq: Once | INTRAMUSCULAR | Status: DC
  Filled 2017-09-26: qty 1

## 2017-09-26 MED ORDER — PROMETHAZINE HCL 25 MG PO TABS
12.5000 mg | ORAL_TABLET | Freq: Once | ORAL | Status: AC
  Administered 2017-09-26: 12.5 mg via ORAL
  Filled 2017-09-26: qty 1

## 2017-09-26 MED ORDER — POTASSIUM CHLORIDE CRYS ER 20 MEQ PO TBCR
60.0000 meq | EXTENDED_RELEASE_TABLET | Freq: Once | ORAL | Status: AC
Start: 2017-09-26 — End: 2017-09-26
  Administered 2017-09-26: 60 meq via ORAL
  Filled 2017-09-26: qty 3

## 2017-09-26 MED ORDER — SODIUM CHLORIDE 0.9 % IV BOLUS (SEPSIS)
1000.0000 mL | Freq: Once | INTRAVENOUS | Status: AC
  Administered 2017-09-26: 1000 mL via INTRAVENOUS

## 2017-09-26 NOTE — ED Provider Notes (Signed)
[facility] EMERGENCY [facility]PART[facility] Provider Note   CSN: [id number] Arrival date & time: 09/26/17  1038     History   Chief Complaint Chief Complaint  Patient presents with  . Dizziness    HPI Taylor Hanson is a 62 y.o. female.  Taylor BrinkMarsha K Hanson is a 62 y.o. Female with history of Meniere's disease, COPD, DM, fibromyalgia and gout, presents to the ED for evaluation of dizziness since Thursday. Pt describes dizziness as "nothing will stop moving or stay in focus when I open my eyes ". Pt reports symptoms have been constant and not improving, with associated nausea, no vomiting and just feeling generally weak.  Patient saw her primary doctor regarding the symptoms on Friday, who felt patient's symptoms were likely due to fluid buildup in the right ear, as a result of patient's recent sinus infection she was treated for.  Primary care doctor encouraged her to use decongestants such as Flonase, Allegra and Mucinex to try and clear the ear effusion and improve her symptoms.  Patient reports she has not had a flare of her Mnire's disease like this in many years, she does see her ENT doctor about once a year, she has been on chlorthalidone chronically for maintenance of this as well as potassium supplementation.  Patient denies any associated headache, vision changes, focal weakness, numbness or tingling in any of her extremities, speech or swallowing difficulty or facial asymmetry.  Patient reports because of nausea and the symptoms she has not been able to go to work has not had much of an appetite and she just needs something to help her feel better, she is not contacted her ENT doctor regarding the symptoms, and reports she would like to be referred to a new ENT doctor.  She denies any associated chest pain, shortness of breath or abdominal pain, no diarrhea.      Past Medical History:  Diagnosis Date  . COPD (chronic obstructive pulmonary disease) (HCC)   . Diabetes  mellitus without complication (HCC)   . Fibromyalgia   . Gout   . Meniere disease     Patient Active Problem List   Diagnosis Date Noted  . COPD GOLD 0  06/16/2017  . Diuretic-induced hypokalemia 06/16/2017  . Morbid obesity due to excess calories (HCC) 06/16/2017  . Dyspnea on exertion 06/15/2017    History reviewed. No pertinent surgical history.  OB History    No data available       Home Medications    Prior to Admission medications   Medication Sig Start Date End Date Taking? Authorizing Provider  albuterol (PROAIR HFA) 108 (90 Base) MCG/ACT inhaler Inhale 1 puff into the lungs every 6 (six) hours as needed for wheezing or shortness of breath. 07/15/17   Nyoka CowdenWert, Michael B, MD  chlorthalidone (HYGROTON) 25 MG tablet Take 25 mg by mouth every other day.     [provider]  Fluticasone-Umeclidin-Vilant (TRELEGY ELLIPTA) 100-62.5-25 MCG/INH AEPB Inhale 1 puff into the lungs daily. 07/29/17   Nyoka CowdenWert, Michael B, MD  loratadine (CLARITIN) 10 MG tablet Take 10 mg by mouth daily.    [provider]  potassium chloride (MICRO-K) 10 MEQ CR capsule Take 10 mEq by mouth daily.     [provider]    Family History Family History  Problem Relation Age of Onset  . Pneumonia Mother   . Heart disease Father     Social History Social History   Tobacco Use  . Smoking status: Former  Smoker    Packs/day: 1.00    Years: 30.00    Pack years: 30.00    Types: Cigarettes    Last attempt to quit: 08/20/2013    Years since quitting: 4.1  . Smokeless tobacco: Never Used  Substance Use Topics  . Alcohol use: No    Frequency: Never  . Drug use: No     Allergies   Patient has no known allergies.   Review of Systems Review of Systems  Constitutional: Positive for fatigue. Negative for chills and fever.  HENT: Positive for congestion, ear pain, sinus pressure and sinus pain. Negative for ear discharge, hearing loss, sore throat and trouble swallowing.   Eyes:  Negative for photophobia, pain, redness and visual disturbance.  Respiratory: Negative for cough, chest tightness and shortness of breath.   Cardiovascular: Negative for chest pain.  Gastrointestinal: Positive for nausea. Negative for abdominal pain, blood in stool, diarrhea and vomiting.  Genitourinary: Negative for dysuria and frequency.  Musculoskeletal: Negative for arthralgias and myalgias.  Skin: Negative for color change, pallor and rash.  Neurological: Positive for dizziness. Negative for syncope, facial asymmetry, speech difficulty, weakness, light-headedness, numbness and headaches.     Physical Exam Updated Vital Signs BP (!) 147/53 (BP Location: Right Arm)   Pulse 63   Temp 98.1 F (36.7 C) (Oral)   Resp 20   SpO2 100%   Physical Exam  Constitutional: She is oriented to person, place, and time. She appears well-developed and well-nourished. No distress.  HENT:  Head: Normocephalic and atraumatic.  Right Ear: No drainage. No mastoid tenderness. Tympanic membrane is bulging. Tympanic membrane is not injected and not erythematous. A middle ear effusion is present.  Left Ear: Tympanic membrane and ear canal normal.  Nose: Mucosal edema present. Right sinus exhibits frontal sinus tenderness. Left sinus exhibits frontal sinus tenderness.  Mouth/Throat: Uvula is midline, oropharynx is clear and moist and mucous membranes are normal.  Eyes: Right eye exhibits no discharge. Left eye exhibits no discharge.  Neck: Neck supple.  Cardiovascular: Normal rate, regular rhythm, normal heart sounds and intact distal pulses.  Pulmonary/Chest: Effort normal and breath sounds normal. No stridor. No respiratory distress. She has no wheezes. She has no rales.  Respirations equal and unlabored, patient able to speak in full sentences, lungs clear to auscultation bilaterally  Abdominal: Soft. Bowel sounds are normal. She exhibits no distension and no mass. There is no tenderness. There is no  guarding.  Musculoskeletal: She exhibits no edema or deformity.  Neurological: She is alert and oriented to person, place, and time. Coordination normal.  Speech is clear, able to follow commands CN III-XII intact Normal strength in upper and lower extremities bilaterally including dorsiflexion and plantar flexion, strong and equal grip strength Sensation normal to light and sharp touch Moves extremities without ataxia, coordination intact Normal finger to nose and rapid alternating movements No pronator drift  Skin: Skin is warm and dry. Capillary refill takes less than 2 seconds. She is not diaphoretic.  Psychiatric: She has a normal mood and affect. Her behavior is normal.  Nursing note and vitals reviewed.    ED Treatments / Results  Labs (all labs ordered are listed, but only abnormal results are displayed) Labs Reviewed  CBC WITH DIFFERENTIAL/PLATELET - Abnormal; Notable for the following components:      Result Value   Hemoglobin 15.8 (*)    All other components within normal limits  BASIC METABOLIC PANEL - Abnormal; Notable for the following components:  Potassium 2.6 (*)    Chloride 96 (*)    Glucose, Bld 147 (*)    All other components within normal limits    EKG  EKG Interpretation None       Radiology No results found.  Procedures Procedures (including critical care time)  Medications Ordered in ED Medications  sodium chloride 0.9 % bolus 1,000 mL (0 mLs Intravenous Stopped 09/26/17 1615)  magnesium sulfate IVPB 2 g 50 mL (0 g Intravenous Stopped 09/26/17 1600)  potassium chloride SA (K-DUR,KLOR-CON) CR tablet 60 mEq (60 mEq Oral Given 09/26/17 1514)  potassium chloride 10 mEq in 100 mL IVPB (0 mEq Intravenous Stopped 09/26/17 1730)  meclizine (ANTIVERT) tablet 25 mg (25 mg Oral Given 09/26/17 1514)  promethazine (PHENERGAN) tablet 12.5 mg (12.5 mg Oral Given 09/26/17 1838)  LORazepam (ATIVAN) tablet 1 mg (1 mg Oral Given 09/26/17 1837)     Initial  Impression / Assessment and Plan / ED Course  I have reviewed the triage vital signs and the nursing notes.  Pertinent labs & imaging results that were available during my care of the patient were reviewed by me and considered in my medical decision making (see chart for details).  Patient presents to the ED for evaluation of dizziness with associated nausea for the past 4 days, known history of Mnire's disease but has not had a flareup in many years.  Recently had sinus infection, saw her PCP on Friday and was noted to have right ear effusion likely causing patient's symptoms.  On initial evaluation vitals normal, no neurologic deficits on exam.  Given that patient has known Mnire's disease and clear effusion noted on exam today feel this is likely the etiology of her symptoms and I have a low suspicion for cerebellar stroke or other cause of central vertigo.  Patient has been using decongestants for 2 days with minimal improvement has not tried anything to manage her symptoms.  Will give meclizine and reassess the patient.  I have reviewed labs, significant for potassium of 2.6, will place with 2 rounds of IV potassium and some p.o. potassium will also give magnesium.  Glucose is elevated at 147, no other electrolyte derangements, normal kidney function.  No leukocytosis, hemoglobin slightly elevated, patient appears a bit dehydrated clinically and I feel this is likely due to to hemoconcentration, patient will be given fluid bolus.  Patient did not get much relief in her symptoms, will try Ativan and Phenergan and see if this helps have discussed with the patient that she will need to continue with decongestants and will likely not be able to completely relieve her symptoms here in the ED patient will need to follow-up with the ENT will provide referral to Dr. Pollyann Kennedy.  After Phenergan and Ativan patient has gotten some relief in her symptoms and feels she is stable to go home, provided prescription  for Phenergan as well as work note.  Return precautions discussed.  Patient to continue to use decongestants to help relieve right ear effusion.  Return precautions discussed.  Potassium replaced here in the ED, will have patient continue with her home p.o. potassium and follow-up to have this rechecked within the next few days.  Final Clinical Impressions(s) / ED Diagnoses   Final diagnoses:  Vertigo  Hypokalemia  Meniere's disease, unspecified laterality    ED Discharge Orders        Ordered    promethazine (PHENERGAN) 25 MG tablet  Every 6 hours PRN     09/26/17 1952  Dartha Lodge, PA-C 09/27/17 1308    Raeford Razor, MD 09/27/17 [phone]

## 2017-09-26 NOTE — ED Triage Notes (Signed)
PT reports having dizziness since Thursday. Pt reports she was recently treated for sinus infection and feels her ears are clogged. Pt endorses nausea but no vomiting.

## 2017-09-26 NOTE — ED Notes (Signed)
Wheeled pt to restroom located beside room. Tolerated well with one assist.

## 2017-09-26 NOTE — Discharge Instructions (Signed)
This exacerbation of your vertigo is due to fluid behind the right eardrum, you will need to continue using decongestants to help relieve this.  Please continue using Mucinex, Flonase, and Allegra daily as we discussed.  You may use phenergan to help with vertigo symptoms. You will need to call tomorrow morning to schedule follow-up appointment with ENT for continued management.  If your dizziness worsens, or you began having severe nausea and vomiting and you are unable to keep down fluids or any other new or concerning symptoms develop please return to the emergency department for reevaluation.

## 2017-09-26 NOTE — ED Notes (Signed)
Potassium 2.6, reported to Juleen ChinaKohut MD

## 2017-10-04 DIAGNOSIS — R42 Dizziness and giddiness: Secondary | ICD-10-CM | POA: Diagnosis not present

## 2017-10-04 DIAGNOSIS — F17290 Nicotine dependence, other tobacco product, uncomplicated: Secondary | ICD-10-CM | POA: Diagnosis not present

## 2017-10-04 DIAGNOSIS — J343 Hypertrophy of nasal turbinates: Secondary | ICD-10-CM | POA: Diagnosis not present

## 2017-10-13 DIAGNOSIS — Z713 Dietary counseling and surveillance: Secondary | ICD-10-CM | POA: Diagnosis not present

## 2017-10-25 DIAGNOSIS — H903 Sensorineural hearing loss, bilateral: Secondary | ICD-10-CM | POA: Diagnosis not present

## 2017-10-27 DIAGNOSIS — Z713 Dietary counseling and surveillance: Secondary | ICD-10-CM | POA: Diagnosis not present

## 2017-10-28 DIAGNOSIS — Z713 Dietary counseling and surveillance: Secondary | ICD-10-CM | POA: Diagnosis not present

## 2017-11-10 DIAGNOSIS — Z713 Dietary counseling and surveillance: Secondary | ICD-10-CM | POA: Diagnosis not present

## 2017-11-24 DIAGNOSIS — Z713 Dietary counseling and surveillance: Secondary | ICD-10-CM | POA: Diagnosis not present

## 2017-12-08 DIAGNOSIS — Z713 Dietary counseling and surveillance: Secondary | ICD-10-CM | POA: Diagnosis not present

## 2018-01-07 DIAGNOSIS — M1A0790 Idiopathic chronic gout, unspecified ankle and foot, without tophus (tophi): Secondary | ICD-10-CM | POA: Diagnosis not present

## 2018-01-07 DIAGNOSIS — J410 Simple chronic bronchitis: Secondary | ICD-10-CM | POA: Diagnosis not present

## 2018-01-07 DIAGNOSIS — E119 Type 2 diabetes mellitus without complications: Secondary | ICD-10-CM | POA: Diagnosis not present

## 2018-01-07 DIAGNOSIS — E7800 Pure hypercholesterolemia, unspecified: Secondary | ICD-10-CM | POA: Diagnosis not present

## 2018-03-16 DIAGNOSIS — J019 Acute sinusitis, unspecified: Secondary | ICD-10-CM | POA: Diagnosis not present

## 2018-04-08 DIAGNOSIS — Z23 Encounter for immunization: Secondary | ICD-10-CM | POA: Diagnosis not present

## 2018-06-06 DIAGNOSIS — L03211 Cellulitis of face: Secondary | ICD-10-CM | POA: Diagnosis not present

## 2018-06-23 DIAGNOSIS — E7800 Pure hypercholesterolemia, unspecified: Secondary | ICD-10-CM | POA: Diagnosis not present

## 2018-06-23 DIAGNOSIS — F419 Anxiety disorder, unspecified: Secondary | ICD-10-CM | POA: Diagnosis not present

## 2018-06-23 DIAGNOSIS — M1A0790 Idiopathic chronic gout, unspecified ankle and foot, without tophus (tophi): Secondary | ICD-10-CM | POA: Diagnosis not present

## 2018-06-23 DIAGNOSIS — G43109 Migraine with aura, not intractable, without status migrainosus: Secondary | ICD-10-CM | POA: Diagnosis not present

## 2018-06-23 DIAGNOSIS — Z0000 Encounter for general adult medical examination without abnormal findings: Secondary | ICD-10-CM | POA: Diagnosis not present

## 2018-06-23 DIAGNOSIS — E119 Type 2 diabetes mellitus without complications: Secondary | ICD-10-CM | POA: Diagnosis not present

## 2018-07-29 DIAGNOSIS — Z1211 Encounter for screening for malignant neoplasm of colon: Secondary | ICD-10-CM | POA: Diagnosis not present

## 2018-07-29 DIAGNOSIS — E876 Hypokalemia: Secondary | ICD-10-CM | POA: Diagnosis not present

## 2018-11-23 IMAGING — CR DG CHEST 2V
2 series · 2 of 2 positions shown · non-contrast
Comparison: 07/07/2012

CLINICAL DATA: Bronchitis for 2 months, continued wheezing,
question infiltrate

EXAM:
CHEST  2 VIEW

[w chest pa]
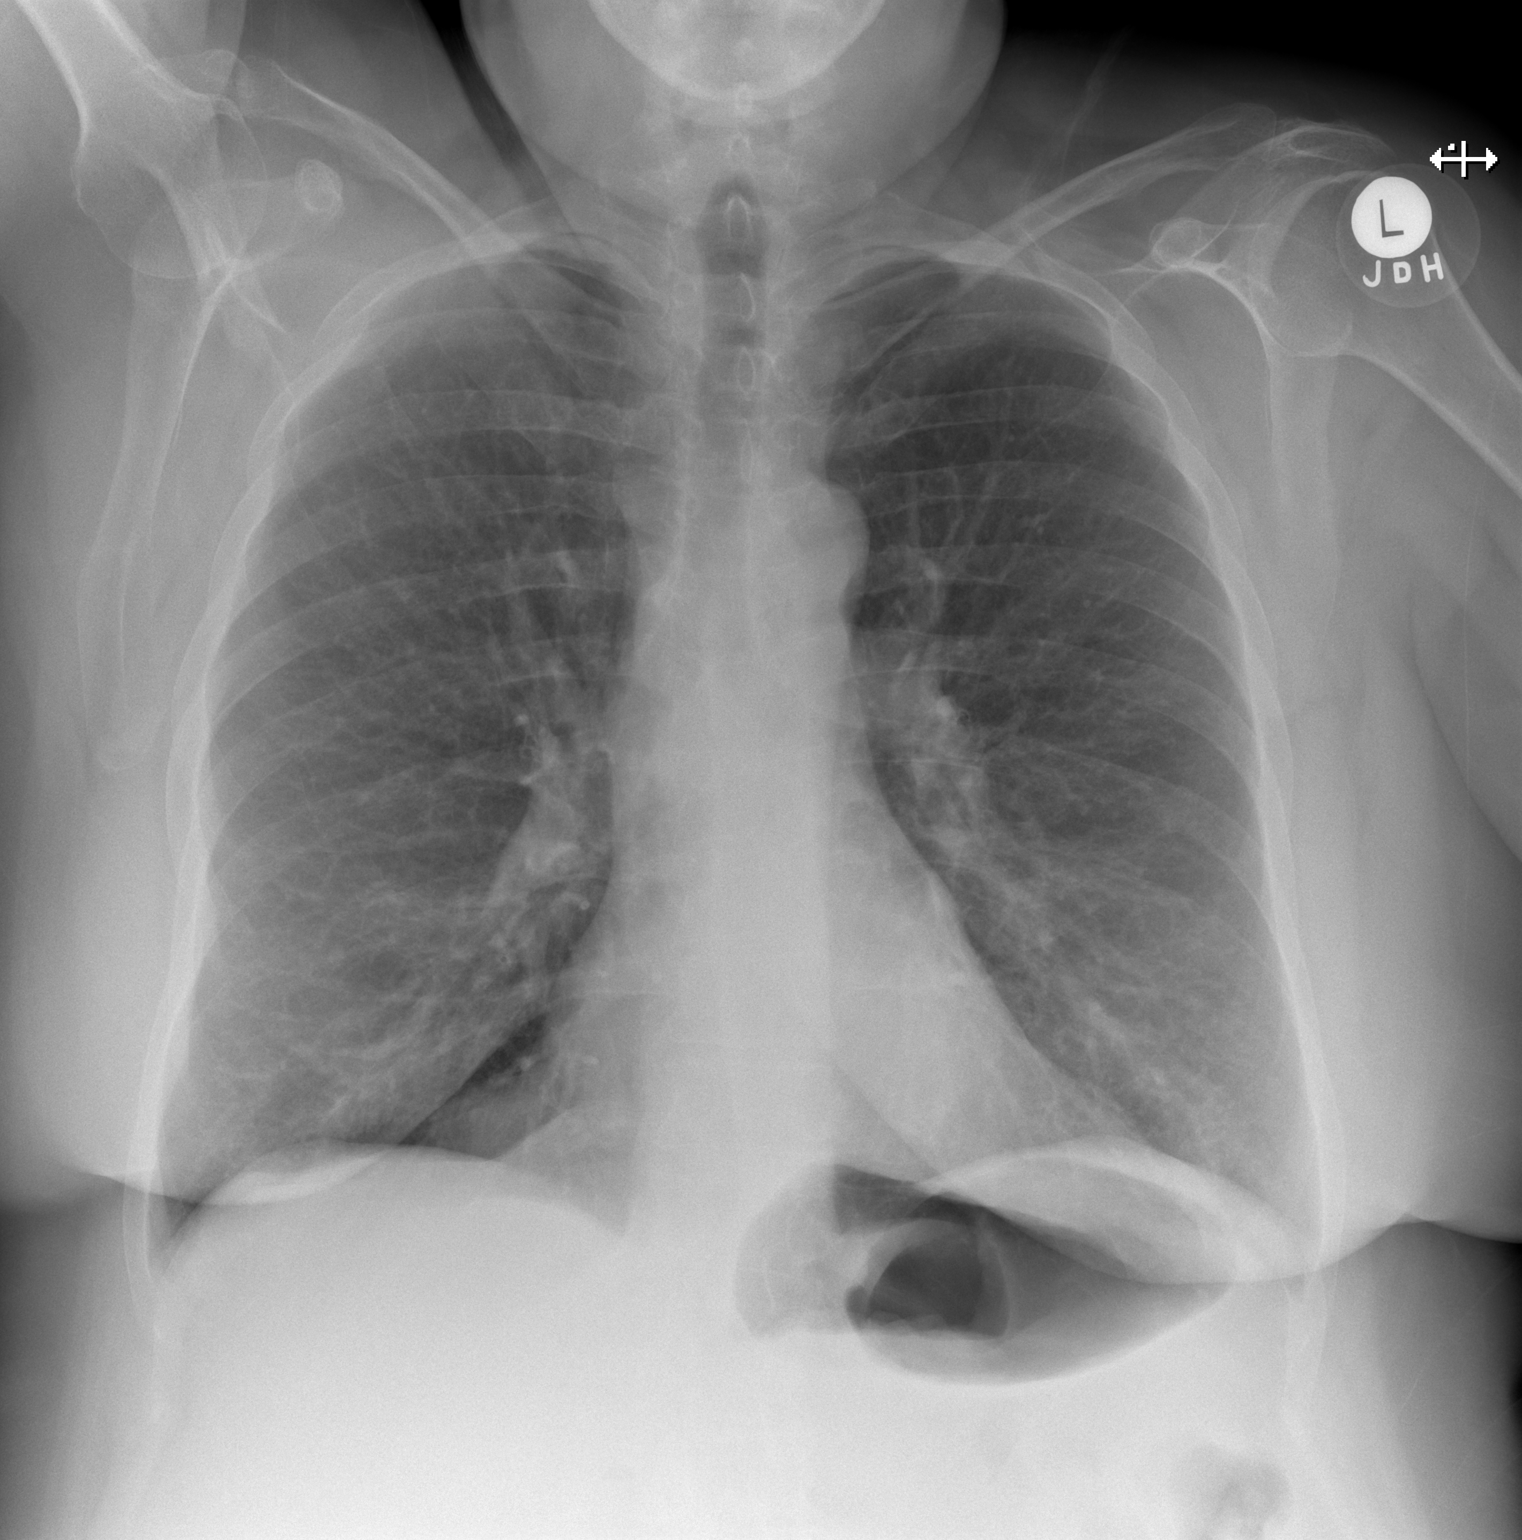

[w chest lat]
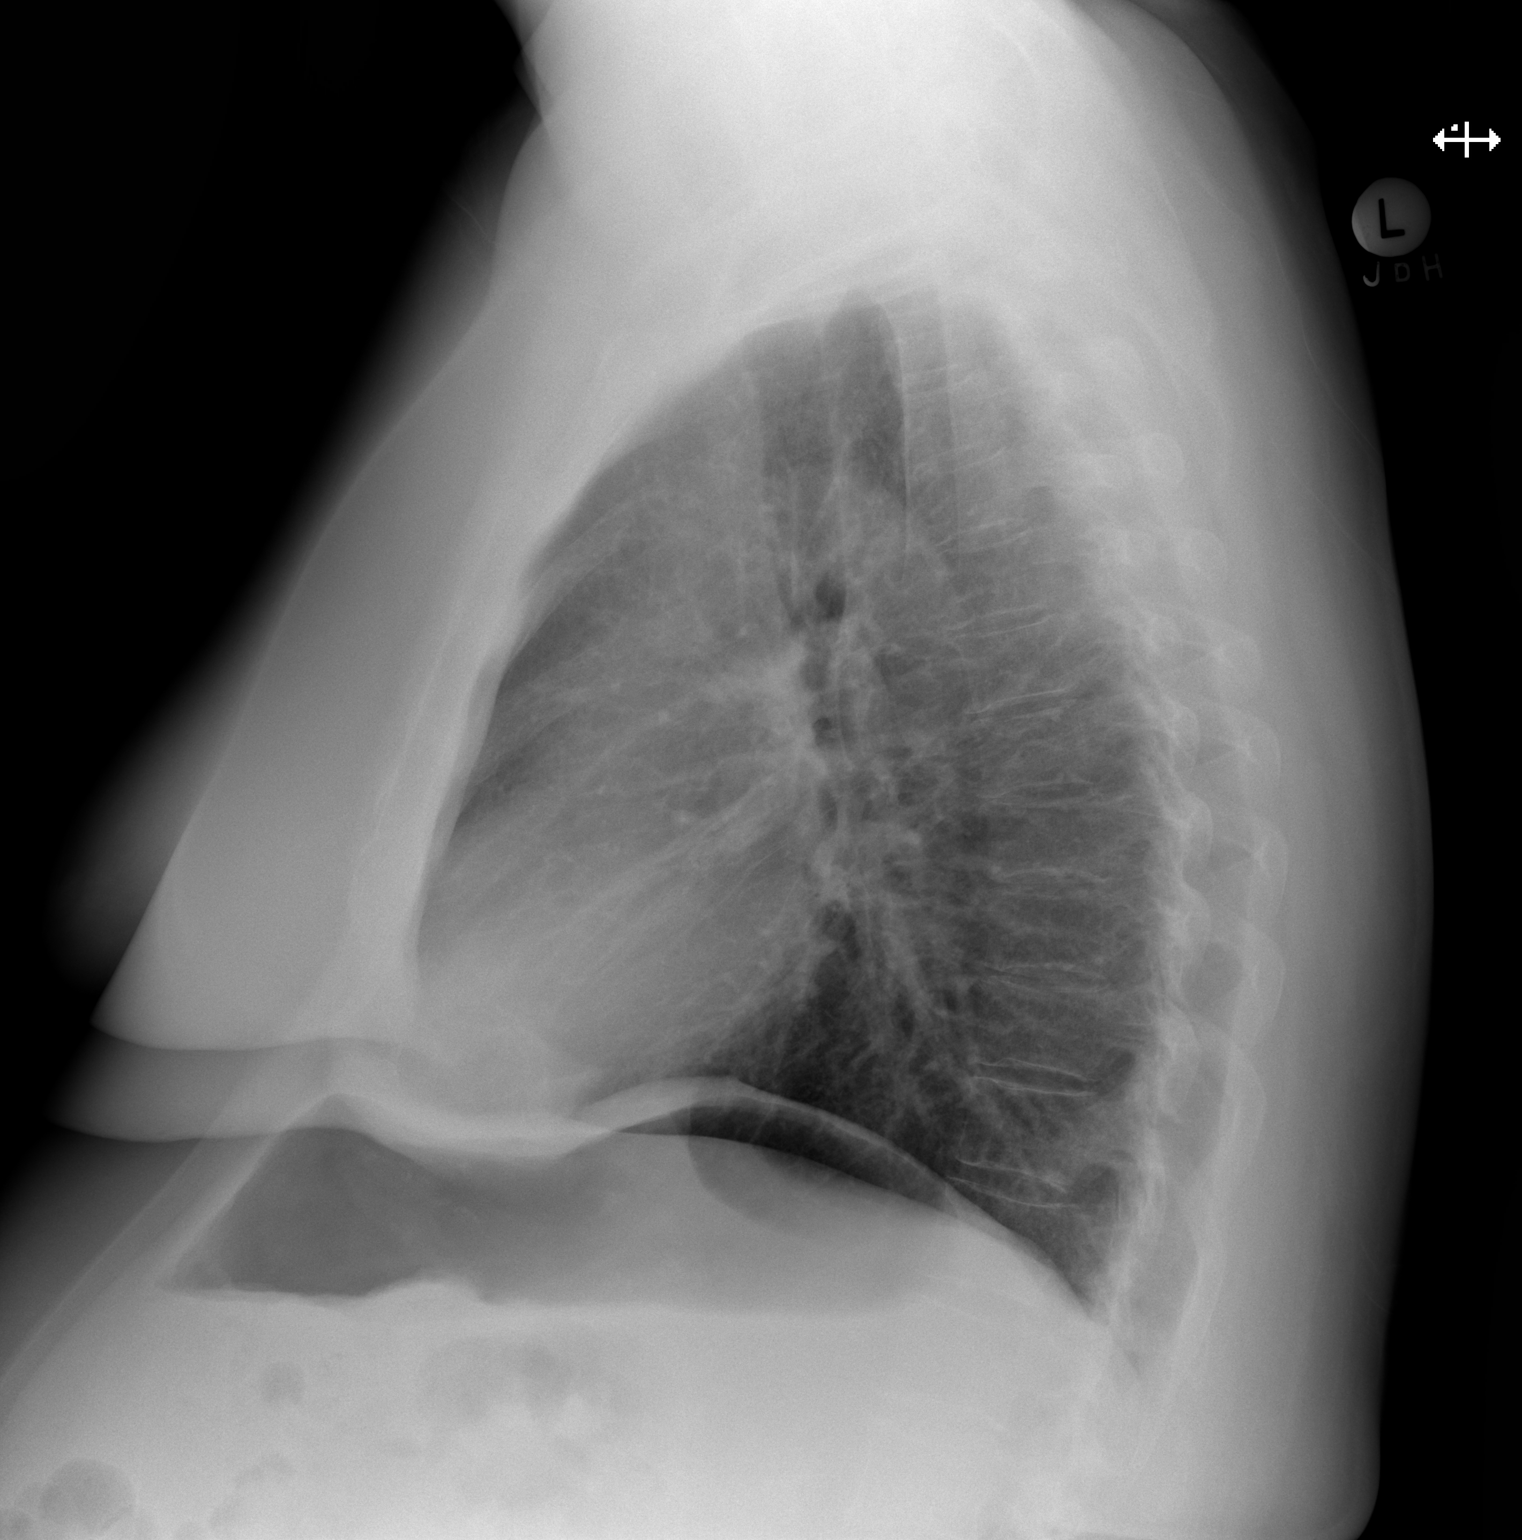

[2 of 2 positions shown; findings below may reference images not displayed]

FINDINGS: Normal heart size, mediastinal contours, and pulmonary vascularity.

Minimal chronic peribronchial thickening.

No acute infiltrate, pleural effusion or pneumothorax.

Bones demineralized.
IMPRESSION: Minimal chronic bronchitic changes.

No acute abnormalities.

## 2018-12-27 DIAGNOSIS — F419 Anxiety disorder, unspecified: Secondary | ICD-10-CM | POA: Diagnosis not present

## 2018-12-27 DIAGNOSIS — G43109 Migraine with aura, not intractable, without status migrainosus: Secondary | ICD-10-CM | POA: Diagnosis not present

## 2018-12-27 DIAGNOSIS — E7800 Pure hypercholesterolemia, unspecified: Secondary | ICD-10-CM | POA: Diagnosis not present

## 2018-12-27 DIAGNOSIS — E119 Type 2 diabetes mellitus without complications: Secondary | ICD-10-CM | POA: Diagnosis not present

## 2019-04-13 DIAGNOSIS — Z23 Encounter for immunization: Secondary | ICD-10-CM | POA: Diagnosis not present

## 2019-04-19 IMAGING — DX DG CHEST 2V
2 series · 2 of 2 positions shown · non-contrast
Comparison: 01/19/2017 .

CLINICAL DATA: Shortness of breath.

EXAM:
CHEST  2 VIEW

[chest pa]
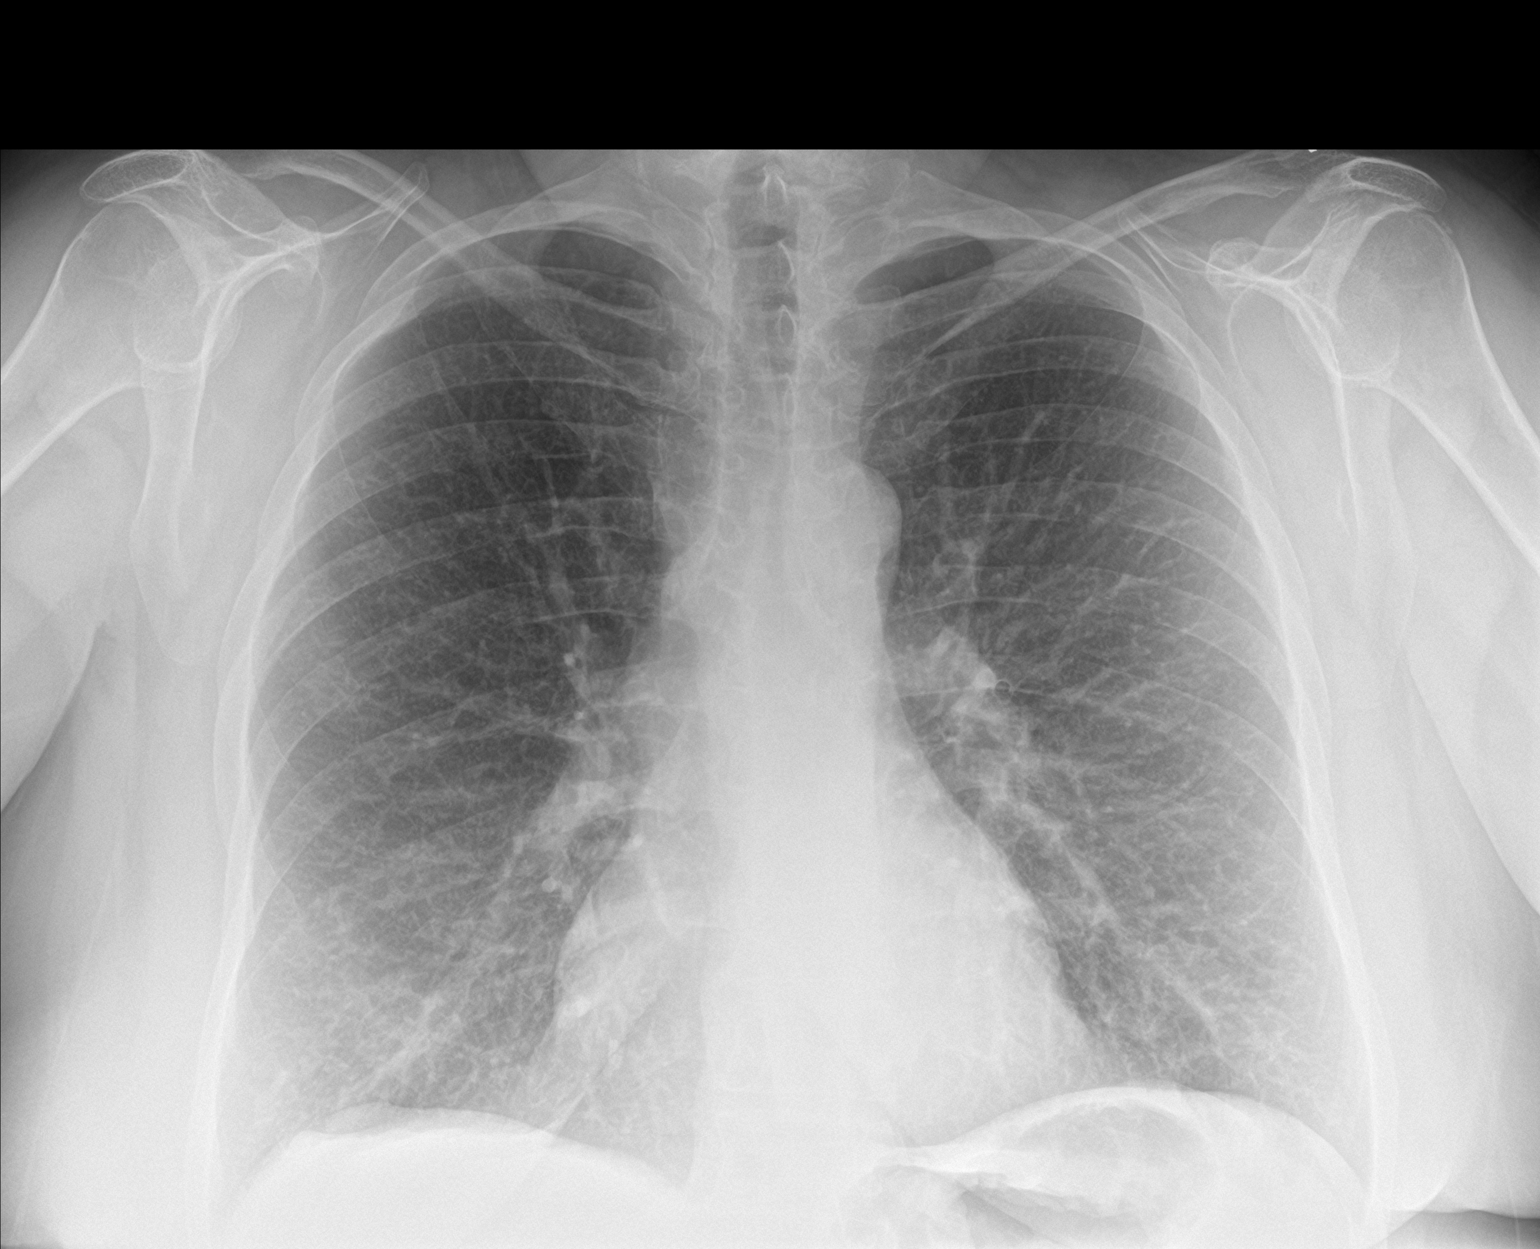

[chest lat]
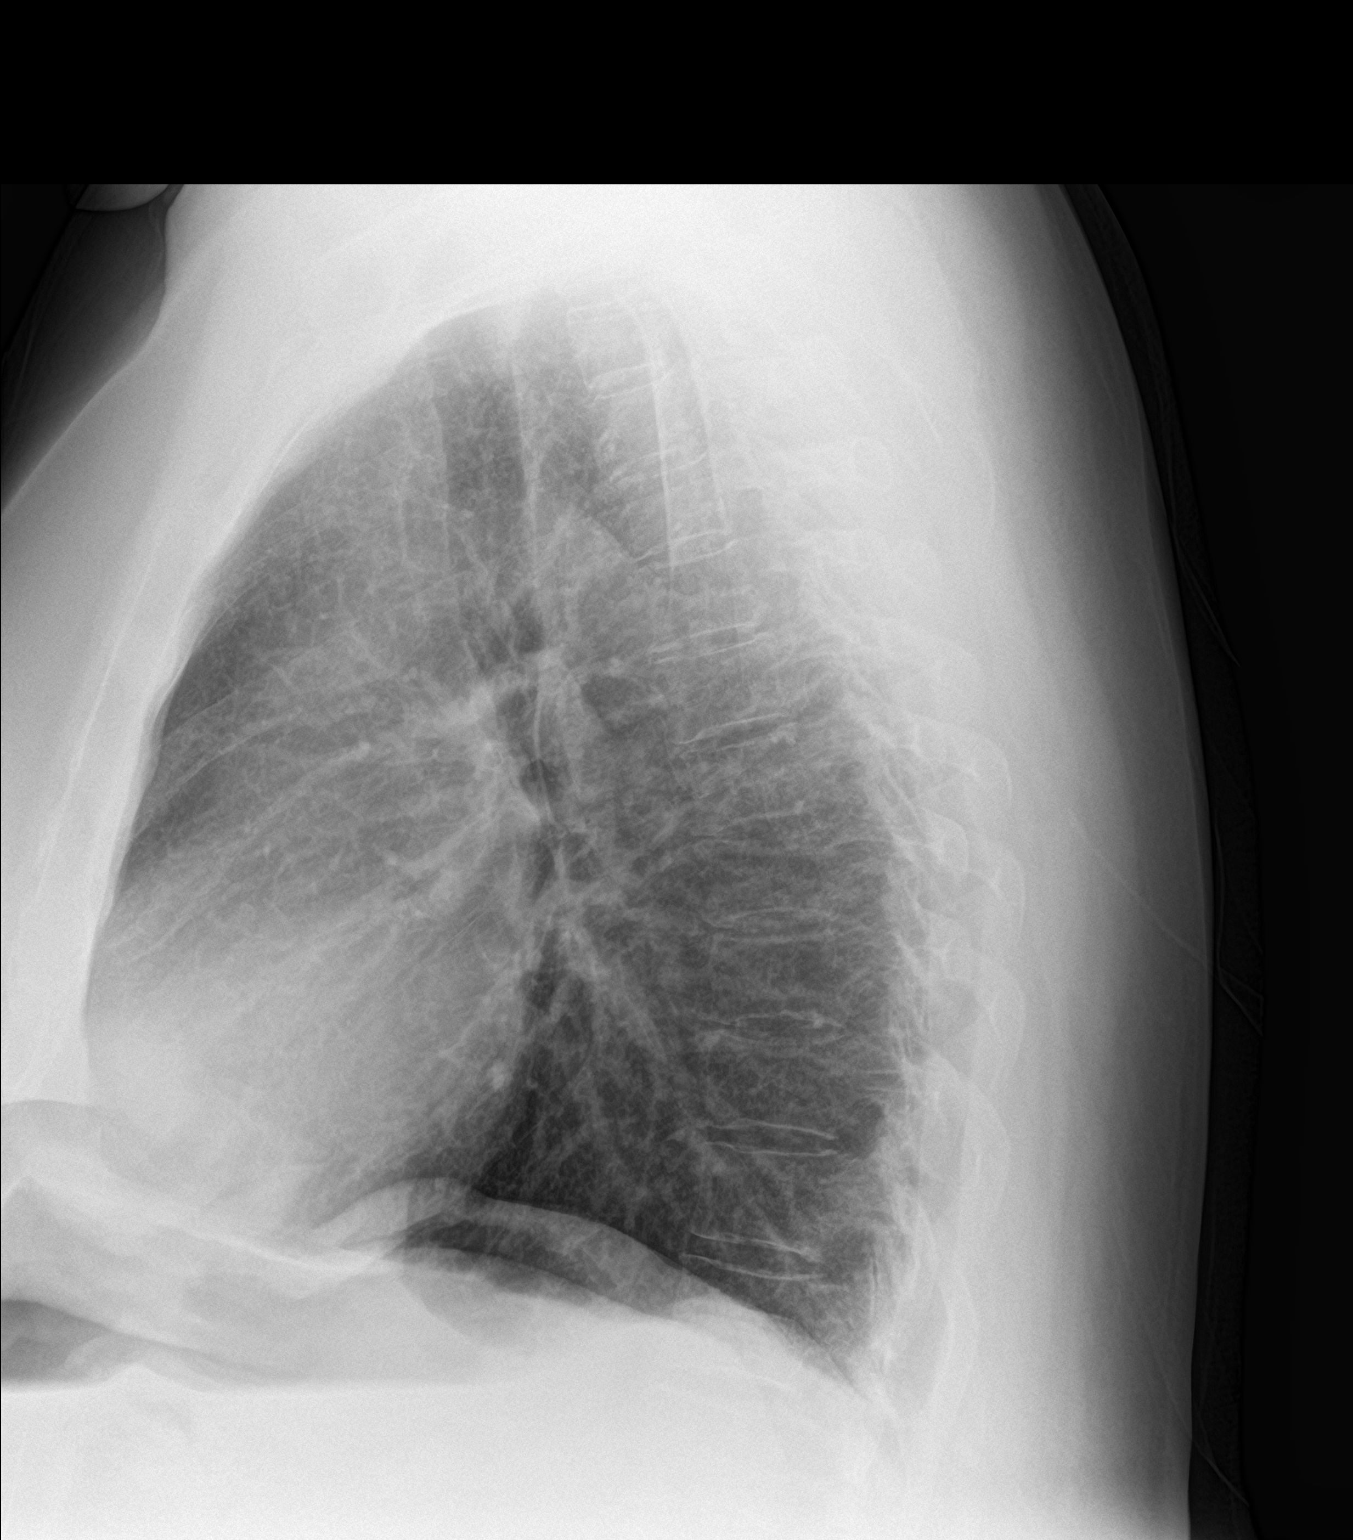

[2 of 2 positions shown; findings below may reference images not displayed]

FINDINGS: Mediastinum and hilar structures are normal. Mild bilateral
interstitial prominence noted. Mild pneumonitis cannot be excluded P
Heart size normal. No pleural effusion or pneumothorax. No acute
bony abnormality identified .
IMPRESSION: Mild bilateral interstitial prominence noted. Mild pneumonitis
cannot be excluded .

## 2019-05-24 DIAGNOSIS — H01009 Unspecified blepharitis unspecified eye, unspecified eyelid: Secondary | ICD-10-CM | POA: Diagnosis not present

## 2019-06-30 DIAGNOSIS — Z23 Encounter for immunization: Secondary | ICD-10-CM | POA: Diagnosis not present

## 2019-06-30 DIAGNOSIS — M797 Fibromyalgia: Secondary | ICD-10-CM | POA: Diagnosis not present

## 2019-06-30 DIAGNOSIS — L719 Rosacea, unspecified: Secondary | ICD-10-CM | POA: Diagnosis not present

## 2019-06-30 DIAGNOSIS — E7800 Pure hypercholesterolemia, unspecified: Secondary | ICD-10-CM | POA: Diagnosis not present

## 2019-06-30 DIAGNOSIS — E559 Vitamin D deficiency, unspecified: Secondary | ICD-10-CM | POA: Diagnosis not present

## 2019-06-30 DIAGNOSIS — Z0000 Encounter for general adult medical examination without abnormal findings: Secondary | ICD-10-CM | POA: Diagnosis not present

## 2019-06-30 DIAGNOSIS — F419 Anxiety disorder, unspecified: Secondary | ICD-10-CM | POA: Diagnosis not present

## 2019-06-30 DIAGNOSIS — E119 Type 2 diabetes mellitus without complications: Secondary | ICD-10-CM | POA: Diagnosis not present

## 2019-07-17 DIAGNOSIS — Z1211 Encounter for screening for malignant neoplasm of colon: Secondary | ICD-10-CM | POA: Diagnosis not present

## 2019-10-02 DIAGNOSIS — J340 Abscess, furuncle and carbuncle of nose: Secondary | ICD-10-CM | POA: Diagnosis not present

## 2019-10-02 DIAGNOSIS — E119 Type 2 diabetes mellitus without complications: Secondary | ICD-10-CM | POA: Diagnosis not present

## 2019-10-06 DIAGNOSIS — E785 Hyperlipidemia, unspecified: Secondary | ICD-10-CM | POA: Diagnosis not present

## 2019-12-29 DIAGNOSIS — E119 Type 2 diabetes mellitus without complications: Secondary | ICD-10-CM | POA: Diagnosis not present

## 2019-12-29 DIAGNOSIS — E7800 Pure hypercholesterolemia, unspecified: Secondary | ICD-10-CM | POA: Diagnosis not present

## 2019-12-29 DIAGNOSIS — E559 Vitamin D deficiency, unspecified: Secondary | ICD-10-CM | POA: Diagnosis not present

## 2019-12-29 DIAGNOSIS — M1A0790 Idiopathic chronic gout, unspecified ankle and foot, without tophus (tophi): Secondary | ICD-10-CM | POA: Diagnosis not present

## 2019-12-29 DIAGNOSIS — M25552 Pain in left hip: Secondary | ICD-10-CM | POA: Diagnosis not present

## 2019-12-29 DIAGNOSIS — M797 Fibromyalgia: Secondary | ICD-10-CM | POA: Diagnosis not present

## 2020-02-22 DIAGNOSIS — M545 Low back pain: Secondary | ICD-10-CM | POA: Diagnosis not present

## 2020-02-22 DIAGNOSIS — M25552 Pain in left hip: Secondary | ICD-10-CM | POA: Diagnosis not present

## 2020-04-10 DIAGNOSIS — E119 Type 2 diabetes mellitus without complications: Secondary | ICD-10-CM | POA: Diagnosis not present

## 2020-04-10 DIAGNOSIS — M797 Fibromyalgia: Secondary | ICD-10-CM | POA: Diagnosis not present

## 2020-04-11 DIAGNOSIS — Z23 Encounter for immunization: Secondary | ICD-10-CM | POA: Diagnosis not present

## 2020-06-13 DIAGNOSIS — E119 Type 2 diabetes mellitus without complications: Secondary | ICD-10-CM | POA: Diagnosis not present

## 2020-06-13 DIAGNOSIS — E7800 Pure hypercholesterolemia, unspecified: Secondary | ICD-10-CM | POA: Diagnosis not present

## 2020-06-13 DIAGNOSIS — M1A0790 Idiopathic chronic gout, unspecified ankle and foot, without tophus (tophi): Secondary | ICD-10-CM | POA: Diagnosis not present

## 2020-06-13 DIAGNOSIS — Z0000 Encounter for general adult medical examination without abnormal findings: Secondary | ICD-10-CM | POA: Diagnosis not present

## 2020-06-13 DIAGNOSIS — R1013 Epigastric pain: Secondary | ICD-10-CM | POA: Diagnosis not present

## 2020-12-27 DIAGNOSIS — F419 Anxiety disorder, unspecified: Secondary | ICD-10-CM | POA: Diagnosis not present

## 2020-12-27 DIAGNOSIS — M797 Fibromyalgia: Secondary | ICD-10-CM | POA: Diagnosis not present

## 2020-12-27 DIAGNOSIS — E7800 Pure hypercholesterolemia, unspecified: Secondary | ICD-10-CM | POA: Diagnosis not present

## 2020-12-27 DIAGNOSIS — E119 Type 2 diabetes mellitus without complications: Secondary | ICD-10-CM | POA: Diagnosis not present

## 2021-04-13 DIAGNOSIS — J305 Allergic rhinitis due to food: Secondary | ICD-10-CM | POA: Diagnosis not present

## 2021-06-26 DIAGNOSIS — E7800 Pure hypercholesterolemia, unspecified: Secondary | ICD-10-CM | POA: Diagnosis not present

## 2021-06-26 DIAGNOSIS — F419 Anxiety disorder, unspecified: Secondary | ICD-10-CM | POA: Diagnosis not present

## 2021-06-26 DIAGNOSIS — Z0000 Encounter for general adult medical examination without abnormal findings: Secondary | ICD-10-CM | POA: Diagnosis not present

## 2021-06-26 DIAGNOSIS — I10 Essential (primary) hypertension: Secondary | ICD-10-CM | POA: Diagnosis not present

## 2021-06-26 DIAGNOSIS — E119 Type 2 diabetes mellitus without complications: Secondary | ICD-10-CM | POA: Diagnosis not present

## 2021-07-09 DIAGNOSIS — Z1211 Encounter for screening for malignant neoplasm of colon: Secondary | ICD-10-CM | POA: Diagnosis not present

## 2021-07-10 DIAGNOSIS — I10 Essential (primary) hypertension: Secondary | ICD-10-CM | POA: Diagnosis not present

## 2021-12-26 DIAGNOSIS — E1169 Type 2 diabetes mellitus with other specified complication: Secondary | ICD-10-CM | POA: Diagnosis not present

## 2021-12-26 DIAGNOSIS — M1A0790 Idiopathic chronic gout, unspecified ankle and foot, without tophus (tophi): Secondary | ICD-10-CM | POA: Diagnosis not present

## 2021-12-26 DIAGNOSIS — R1013 Epigastric pain: Secondary | ICD-10-CM | POA: Diagnosis not present

## 2021-12-26 DIAGNOSIS — I10 Essential (primary) hypertension: Secondary | ICD-10-CM | POA: Diagnosis not present

## 2022-04-10 DIAGNOSIS — Z23 Encounter for immunization: Secondary | ICD-10-CM | POA: Diagnosis not present

## 2022-04-20 DIAGNOSIS — E119 Type 2 diabetes mellitus without complications: Secondary | ICD-10-CM | POA: Diagnosis not present

## 2022-05-02 DIAGNOSIS — H2513 Age-related nuclear cataract, bilateral: Secondary | ICD-10-CM | POA: Diagnosis not present

## 2022-05-02 DIAGNOSIS — E119 Type 2 diabetes mellitus without complications: Secondary | ICD-10-CM | POA: Diagnosis not present

## 2022-07-24 DIAGNOSIS — J410 Simple chronic bronchitis: Secondary | ICD-10-CM | POA: Diagnosis not present

## 2022-07-24 DIAGNOSIS — E119 Type 2 diabetes mellitus without complications: Secondary | ICD-10-CM | POA: Diagnosis not present

## 2022-07-24 DIAGNOSIS — E7800 Pure hypercholesterolemia, unspecified: Secondary | ICD-10-CM | POA: Diagnosis not present

## 2022-07-24 DIAGNOSIS — I10 Essential (primary) hypertension: Secondary | ICD-10-CM | POA: Diagnosis not present

## 2022-07-24 DIAGNOSIS — M797 Fibromyalgia: Secondary | ICD-10-CM | POA: Diagnosis not present

## 2022-07-24 DIAGNOSIS — Z0000 Encounter for general adult medical examination without abnormal findings: Secondary | ICD-10-CM | POA: Diagnosis not present

## 2022-08-02 DIAGNOSIS — Z1211 Encounter for screening for malignant neoplasm of colon: Secondary | ICD-10-CM | POA: Diagnosis not present

## 2022-09-21 DIAGNOSIS — K219 Gastro-esophageal reflux disease without esophagitis: Secondary | ICD-10-CM | POA: Diagnosis not present

## 2022-09-27 DIAGNOSIS — Z1212 Encounter for screening for malignant neoplasm of rectum: Secondary | ICD-10-CM | POA: Diagnosis not present

## 2022-09-27 DIAGNOSIS — Z1211 Encounter for screening for malignant neoplasm of colon: Secondary | ICD-10-CM | POA: Diagnosis not present

## 2022-10-29 DIAGNOSIS — K635 Polyp of colon: Secondary | ICD-10-CM | POA: Diagnosis not present

## 2022-10-29 DIAGNOSIS — R195 Other fecal abnormalities: Secondary | ICD-10-CM | POA: Diagnosis not present

## 2023-02-04 DIAGNOSIS — E7800 Pure hypercholesterolemia, unspecified: Secondary | ICD-10-CM | POA: Diagnosis not present

## 2023-02-04 DIAGNOSIS — E119 Type 2 diabetes mellitus without complications: Secondary | ICD-10-CM | POA: Diagnosis not present

## 2023-02-04 DIAGNOSIS — J410 Simple chronic bronchitis: Secondary | ICD-10-CM | POA: Diagnosis not present

## 2023-02-04 DIAGNOSIS — M1A0790 Idiopathic chronic gout, unspecified ankle and foot, without tophus (tophi): Secondary | ICD-10-CM | POA: Diagnosis not present

## 2023-02-04 DIAGNOSIS — I10 Essential (primary) hypertension: Secondary | ICD-10-CM | POA: Diagnosis not present

## 2023-04-16 DIAGNOSIS — Z23 Encounter for immunization: Secondary | ICD-10-CM | POA: Diagnosis not present

## 2023-04-27 DIAGNOSIS — N950 Postmenopausal bleeding: Secondary | ICD-10-CM | POA: Diagnosis not present

## 2023-04-27 DIAGNOSIS — M545 Low back pain, unspecified: Secondary | ICD-10-CM | POA: Diagnosis not present

## 2023-04-27 DIAGNOSIS — E1169 Type 2 diabetes mellitus with other specified complication: Secondary | ICD-10-CM | POA: Diagnosis not present

## 2023-04-29 DIAGNOSIS — N950 Postmenopausal bleeding: Secondary | ICD-10-CM | POA: Diagnosis not present

## 2023-04-29 DIAGNOSIS — Z124 Encounter for screening for malignant neoplasm of cervix: Secondary | ICD-10-CM | POA: Diagnosis not present

## 2023-05-10 DIAGNOSIS — N950 Postmenopausal bleeding: Secondary | ICD-10-CM | POA: Diagnosis not present

## 2023-07-31 DIAGNOSIS — J069 Acute upper respiratory infection, unspecified: Secondary | ICD-10-CM | POA: Diagnosis not present

## 2023-07-31 DIAGNOSIS — U071 COVID-19: Secondary | ICD-10-CM | POA: Diagnosis not present

## 2023-08-10 DIAGNOSIS — E1169 Type 2 diabetes mellitus with other specified complication: Secondary | ICD-10-CM | POA: Diagnosis not present

## 2023-08-10 DIAGNOSIS — I10 Essential (primary) hypertension: Secondary | ICD-10-CM | POA: Diagnosis not present

## 2023-08-10 DIAGNOSIS — J410 Simple chronic bronchitis: Secondary | ICD-10-CM | POA: Diagnosis not present

## 2023-08-10 DIAGNOSIS — E7800 Pure hypercholesterolemia, unspecified: Secondary | ICD-10-CM | POA: Diagnosis not present

## 2023-08-10 DIAGNOSIS — Z0000 Encounter for general adult medical examination without abnormal findings: Secondary | ICD-10-CM | POA: Diagnosis not present

## 2023-08-10 DIAGNOSIS — E119 Type 2 diabetes mellitus without complications: Secondary | ICD-10-CM | POA: Diagnosis not present

## 2023-10-21 DIAGNOSIS — Z713 Dietary counseling and surveillance: Secondary | ICD-10-CM | POA: Diagnosis not present

## 2024-02-10 DIAGNOSIS — R29898 Other symptoms and signs involving the musculoskeletal system: Secondary | ICD-10-CM | POA: Diagnosis not present

## 2024-02-10 DIAGNOSIS — E7800 Pure hypercholesterolemia, unspecified: Secondary | ICD-10-CM | POA: Diagnosis not present

## 2024-02-10 DIAGNOSIS — Z8739 Personal history of other diseases of the musculoskeletal system and connective tissue: Secondary | ICD-10-CM | POA: Diagnosis not present

## 2024-02-10 DIAGNOSIS — E119 Type 2 diabetes mellitus without complications: Secondary | ICD-10-CM | POA: Diagnosis not present

## 2024-02-10 DIAGNOSIS — I10 Essential (primary) hypertension: Secondary | ICD-10-CM | POA: Diagnosis not present

## 2024-02-11 ENCOUNTER — Encounter: Payer: Self-pay | Admitting: Family Medicine

## 2024-02-11 ENCOUNTER — Other Ambulatory Visit: Payer: Self-pay | Admitting: Family Medicine

## 2024-02-11 DIAGNOSIS — R29898 Other symptoms and signs involving the musculoskeletal system: Secondary | ICD-10-CM

## 2024-02-16 ENCOUNTER — Encounter: Payer: Self-pay | Admitting: Family Medicine

## 2024-02-24 ENCOUNTER — Other Ambulatory Visit

## 2024-03-31 ENCOUNTER — Ambulatory Visit
Admission: RE | Admit: 2024-03-31 | Discharge: 2024-03-31 | Disposition: A | Discharge status: Home / Self Care | Source: Ambulatory Visit | Attending: Family Medicine | Admitting: Family Medicine

## 2024-03-31 DIAGNOSIS — R29898 Other symptoms and signs involving the musculoskeletal system: Secondary | ICD-10-CM

## 2024-05-02 DIAGNOSIS — N858 Other specified noninflammatory disorders of uterus: Secondary | ICD-10-CM | POA: Diagnosis not present

## 2024-05-02 DIAGNOSIS — N950 Postmenopausal bleeding: Secondary | ICD-10-CM | POA: Diagnosis not present

## 2024-05-09 DIAGNOSIS — N950 Postmenopausal bleeding: Secondary | ICD-10-CM | POA: Diagnosis not present

## 2024-05-09 DIAGNOSIS — N858 Other specified noninflammatory disorders of uterus: Secondary | ICD-10-CM | POA: Diagnosis not present

## 2024-05-26 DIAGNOSIS — D649 Anemia, unspecified: Secondary | ICD-10-CM | POA: Diagnosis not present

## 2024-06-06 DIAGNOSIS — K080 Exfoliation of teeth due to systemic causes: Secondary | ICD-10-CM | POA: Diagnosis not present

## 2024-08-15 ENCOUNTER — Encounter: Payer: Self-pay | Admitting: Obstetrics and Gynecology

## 2024-08-15 ENCOUNTER — Ambulatory Visit: Payer: Self-pay | Admitting: Obstetrics and Gynecology

## 2024-08-15 VITALS — BP 120/60 | HR 73 | Wt 210.2 lb

## 2024-08-15 DIAGNOSIS — N950 Postmenopausal bleeding: Secondary | ICD-10-CM | POA: Insufficient documentation

## 2024-08-15 LAB — CBC
HCT: 38.3 % (ref 35.9–46.0)
Hemoglobin: 12.4 g/dL (ref 11.7–15.5)
MCH: 27.7 pg (ref 27.0–33.0)
MCHC: 32.4 g/dL (ref 31.6–35.4)
MCV: 85.7 fL (ref 81.4–101.7)
MPV: 10.8 fL (ref 7.5–12.5)
Platelets: 366 10*3/uL (ref 140–400)
RBC: 4.47 Million/uL (ref 3.80–5.10)
RDW: 14.6 % (ref 11.0–15.0)
WBC: 11.2 10*3/uL — ABNORMAL HIGH (ref 3.8–10.8)

## 2024-08-16 ENCOUNTER — Ambulatory Visit: Payer: Self-pay | Admitting: Obstetrics and Gynecology

## 2024-08-29 ENCOUNTER — Other Ambulatory Visit: Admitting: Obstetrics and Gynecology

## 2024-08-29 ENCOUNTER — Other Ambulatory Visit
